# Patient Record
Sex: Male | Born: 1935 | Race: White | Hispanic: No | Smoking: Never smoker
Health system: Southern US, Community
[De-identification: ages and names within clinical notes are randomized; demographics above are authoritative.]

## PROBLEM LIST (undated history)

## (undated) DIAGNOSIS — I10 Essential (primary) hypertension: Secondary | ICD-10-CM

## (undated) DIAGNOSIS — I509 Heart failure, unspecified: Secondary | ICD-10-CM

---

## 2004-01-05 ENCOUNTER — Emergency Department (HOSPITAL_COMMUNITY): Admission: EM | Admit: 2004-01-05 | Discharge: 2004-01-05 | Payer: Self-pay | Admitting: Emergency Medicine

## 2006-10-04 ENCOUNTER — Emergency Department (HOSPITAL_COMMUNITY): Admission: EM | Admit: 2006-10-04 | Discharge: 2006-10-04 | Payer: Self-pay | Admitting: Family Medicine

## 2007-04-26 ENCOUNTER — Inpatient Hospital Stay (HOSPITAL_COMMUNITY): Admission: EM | Admit: 2007-04-26 | Discharge: 2007-05-02 | Payer: Self-pay | Admitting: Emergency Medicine

## 2007-04-27 ENCOUNTER — Encounter (INDEPENDENT_AMBULATORY_CARE_PROVIDER_SITE_OTHER): Payer: Self-pay | Admitting: Internal Medicine

## 2007-05-01 ENCOUNTER — Encounter: Payer: Self-pay | Admitting: Cardiovascular Disease

## 2007-05-14 ENCOUNTER — Ambulatory Visit: Payer: Self-pay | Admitting: *Deleted

## 2007-05-28 ENCOUNTER — Ambulatory Visit (HOSPITAL_COMMUNITY): Admission: RE | Admit: 2007-05-28 | Discharge: 2007-05-29 | Payer: Self-pay | Admitting: Cardiovascular Disease

## 2007-08-28 ENCOUNTER — Ambulatory Visit: Payer: Self-pay | Admitting: *Deleted

## 2007-08-28 ENCOUNTER — Inpatient Hospital Stay (HOSPITAL_COMMUNITY): Admission: RE | Admit: 2007-08-28 | Discharge: 2007-08-30 | Payer: Self-pay | Admitting: *Deleted

## 2007-10-01 ENCOUNTER — Ambulatory Visit: Payer: Self-pay | Admitting: *Deleted

## 2007-10-01 ENCOUNTER — Encounter: Admission: RE | Admit: 2007-10-01 | Discharge: 2007-10-01 | Payer: Self-pay | Admitting: *Deleted

## 2009-01-02 IMAGING — CR DG CHEST 1V PORT
2 series · 2 of 2 positions shown · non-contrast
Comparison: 04/26/07.

CLINICAL DATA: Hypertensive crisis.  Congestive heart failure. 
 PORTABLE CHEST ? 1 VIEW:

[view not recorded (1 of 2)]
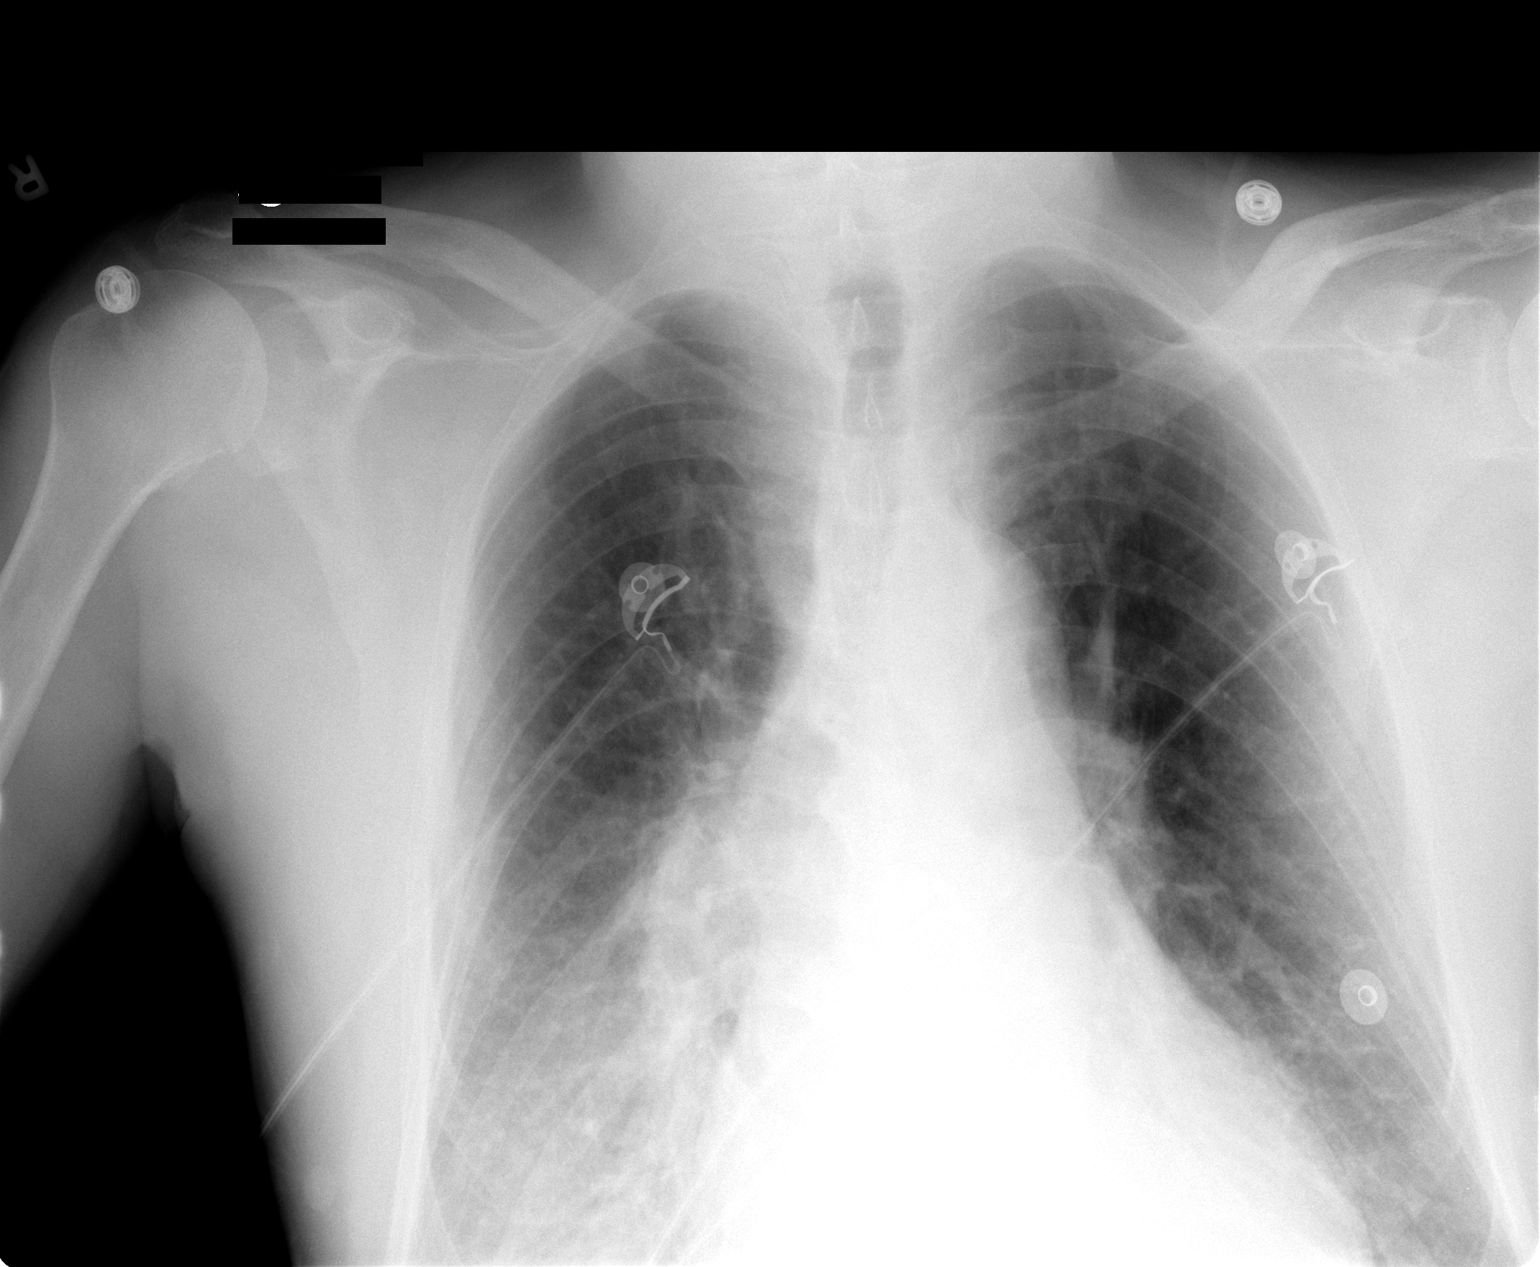

[view not recorded (2 of 2)]
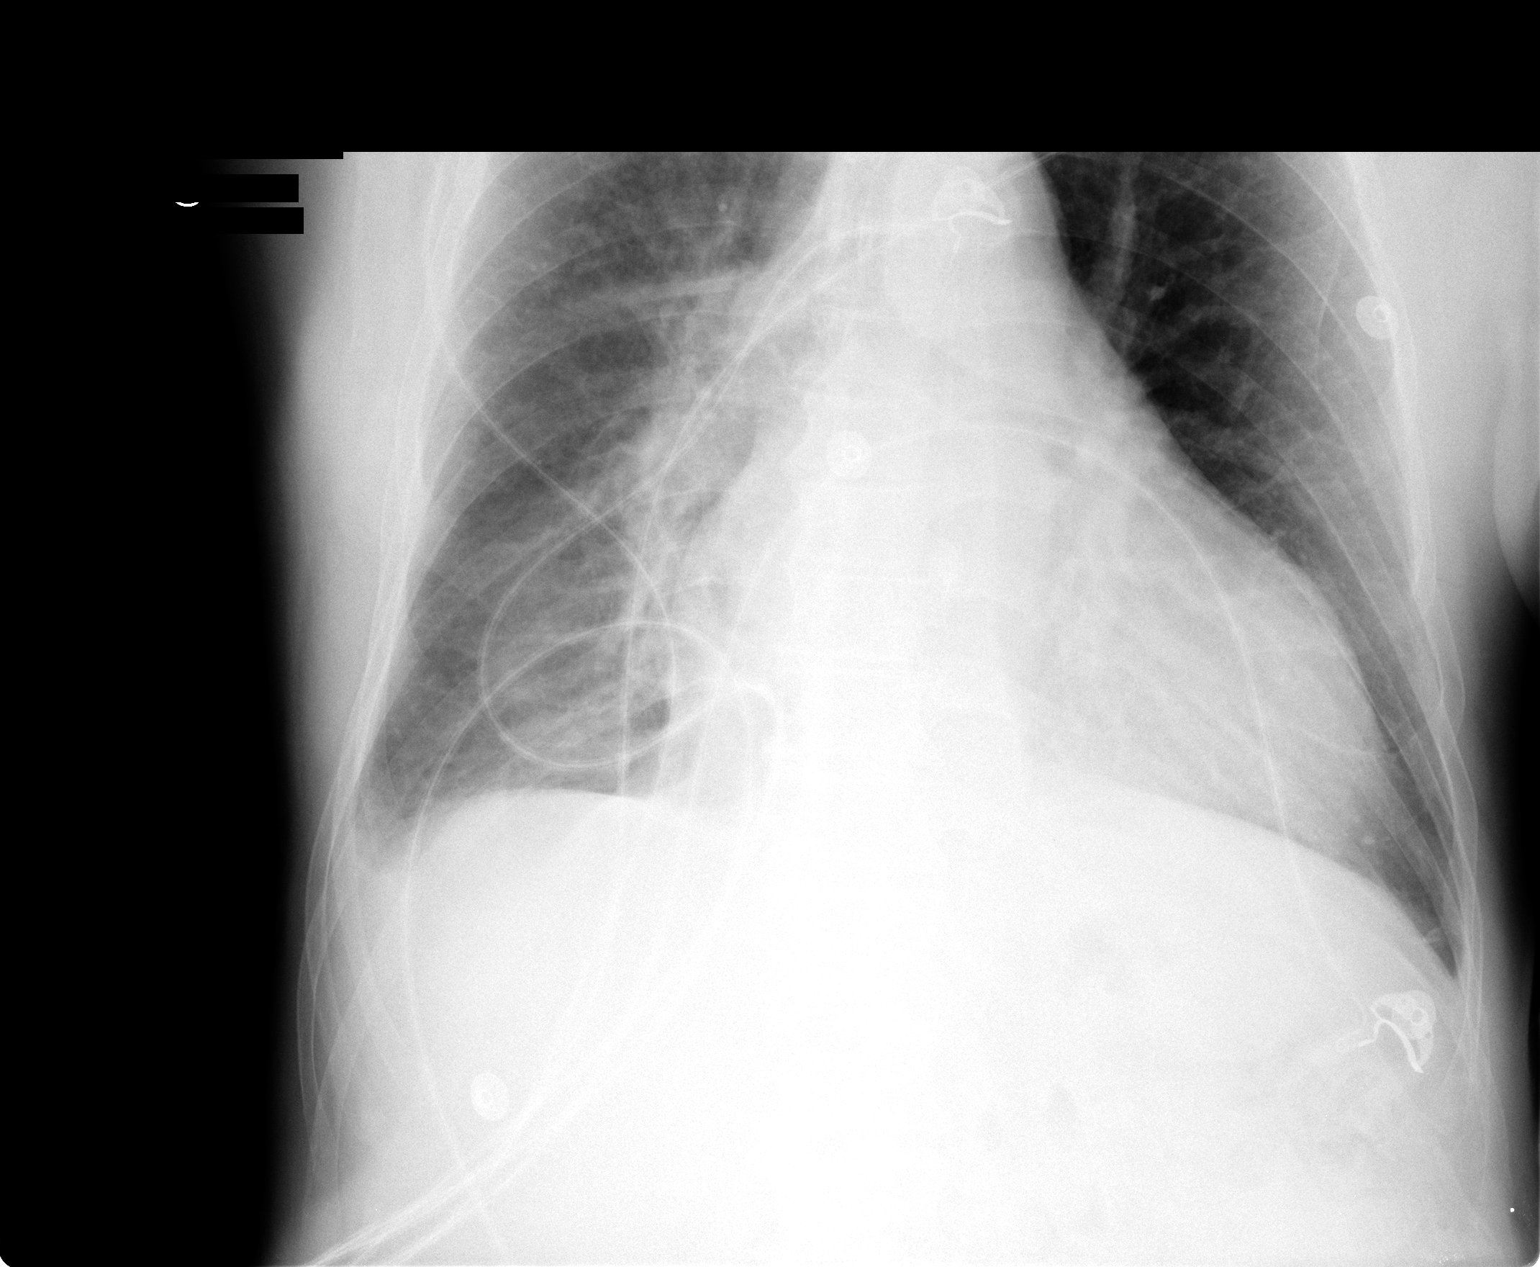

[2 of 2 positions shown; findings below may reference images not displayed]

FINDINGS: Cardiomegaly is again noted.  Small right pleural effusion appears decreased.  Interstitial pulmonary edema appears slightly improved.
IMPRESSION: Some interval improvement in interstitial edema and small right effusion.

## 2009-01-03 IMAGING — CR DG CHEST 2V
2 series · 2 of 2 positions shown · non-contrast
Comparison: 04/27/07.

CLINICAL DATA: Shortness of breath.
 CHEST - 2 VIEW:

[w chest pa]
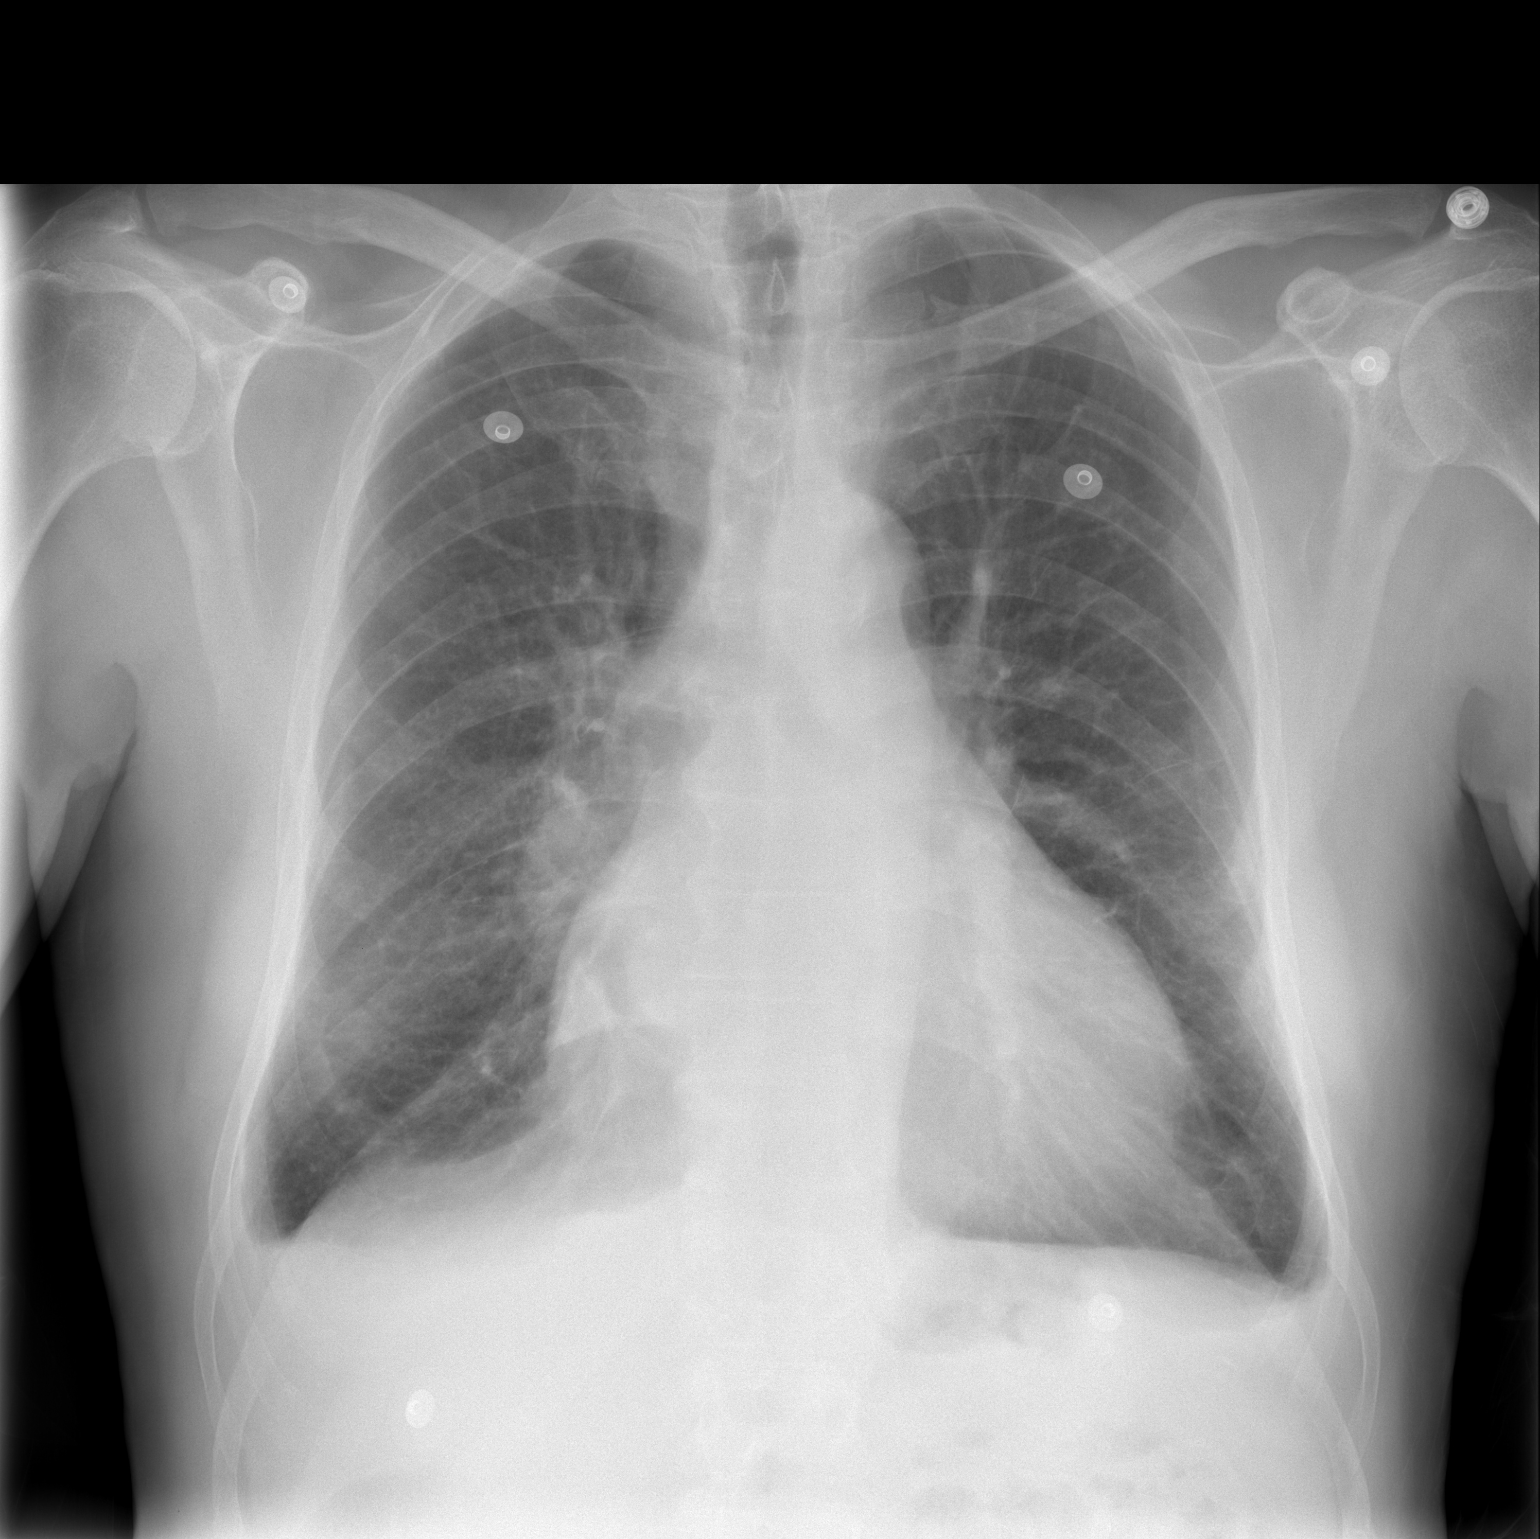

[w chest lat]
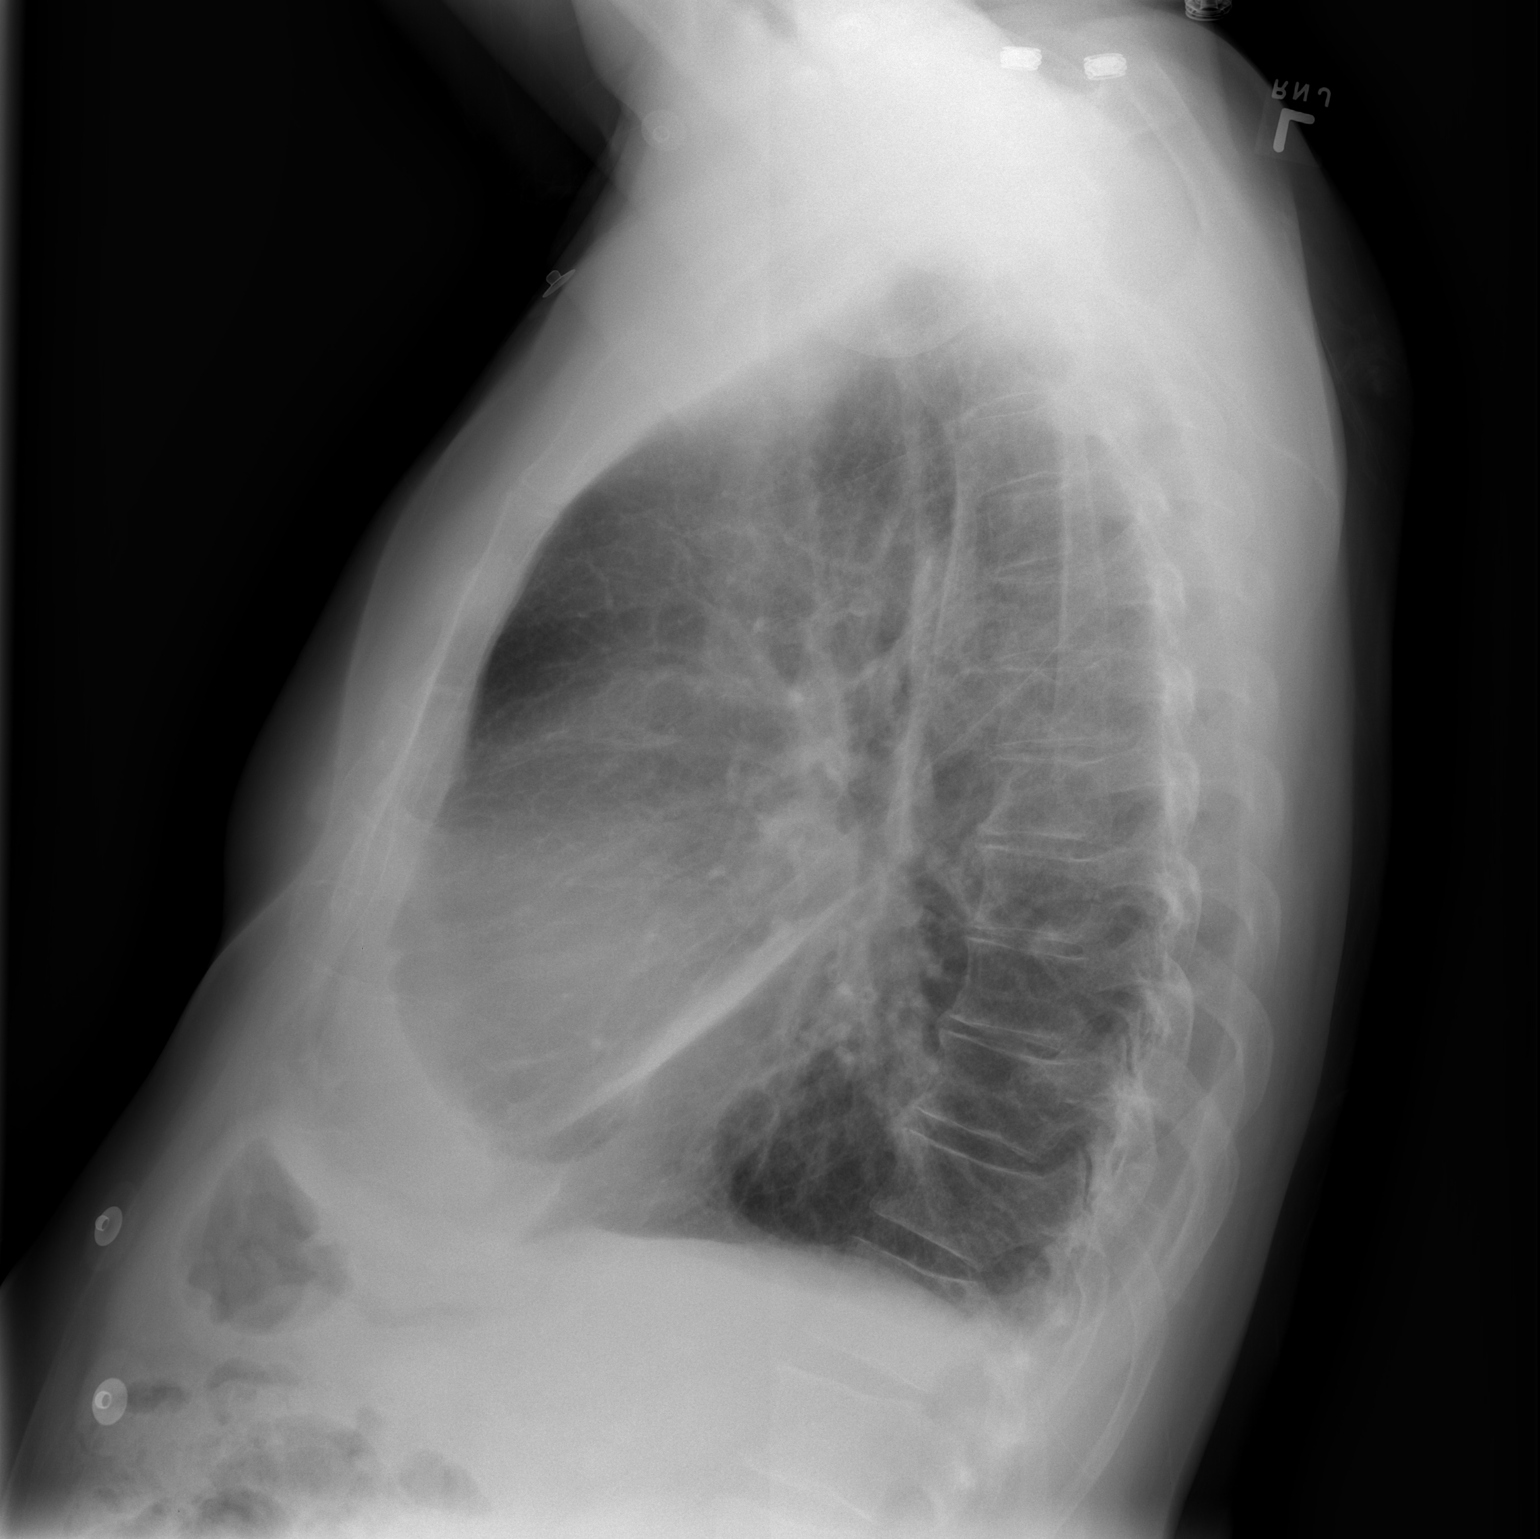

[2 of 2 positions shown; findings below may reference images not displayed]

FINDINGS: Interval improved aeration with partial clearing edema.   Cardiac enlargement redemonstrated. Small effusions.
IMPRESSION: Improved aeration with partial clearing edema.

## 2009-02-26 ENCOUNTER — Emergency Department (HOSPITAL_COMMUNITY): Admission: EM | Admit: 2009-02-26 | Discharge: 2009-02-26 | Payer: Self-pay | Admitting: Emergency Medicine

## 2009-02-26 ENCOUNTER — Emergency Department (HOSPITAL_COMMUNITY): Admission: EM | Admit: 2009-02-26 | Discharge: 2009-02-26 | Payer: Self-pay | Admitting: Family Medicine

## 2009-04-14 ENCOUNTER — Encounter: Payer: Self-pay | Admitting: Emergency Medicine

## 2009-04-15 ENCOUNTER — Inpatient Hospital Stay (HOSPITAL_COMMUNITY): Admission: RE | Admit: 2009-04-15 | Discharge: 2009-04-17 | Payer: Self-pay | Admitting: Cardiology

## 2009-05-05 IMAGING — RF DG ABDOMEN 2V
1 series · 7 of 7 positions shown · non-contrast
Comparison: CT 05/01/2007

CLINICAL DATA: Aortic aneurysm

ABDOMEN - 2 VIEW

[Series 1: run · 7 of 7 slices shown]
[im 1/7]
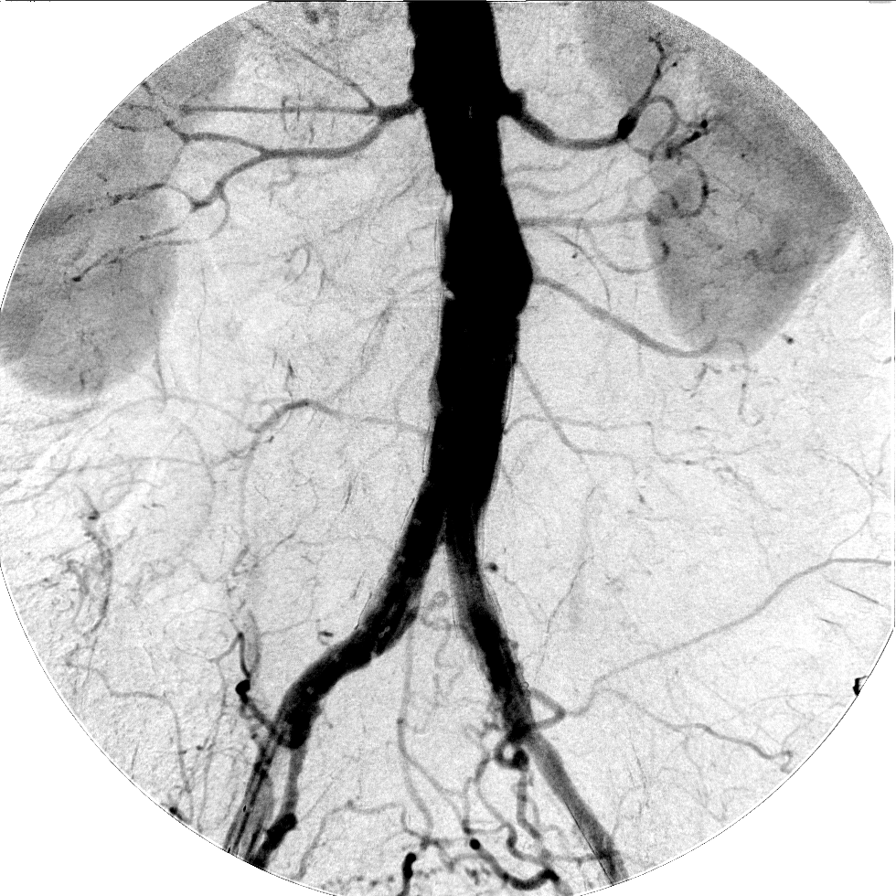
[im 2/7]
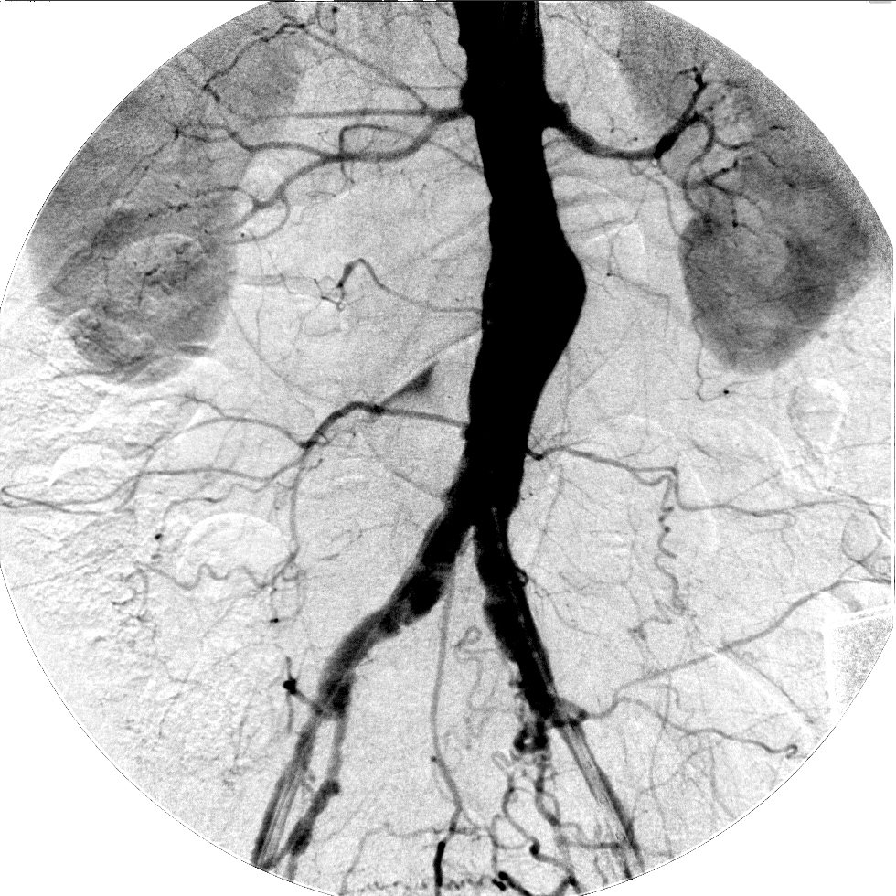
[im 3/7]
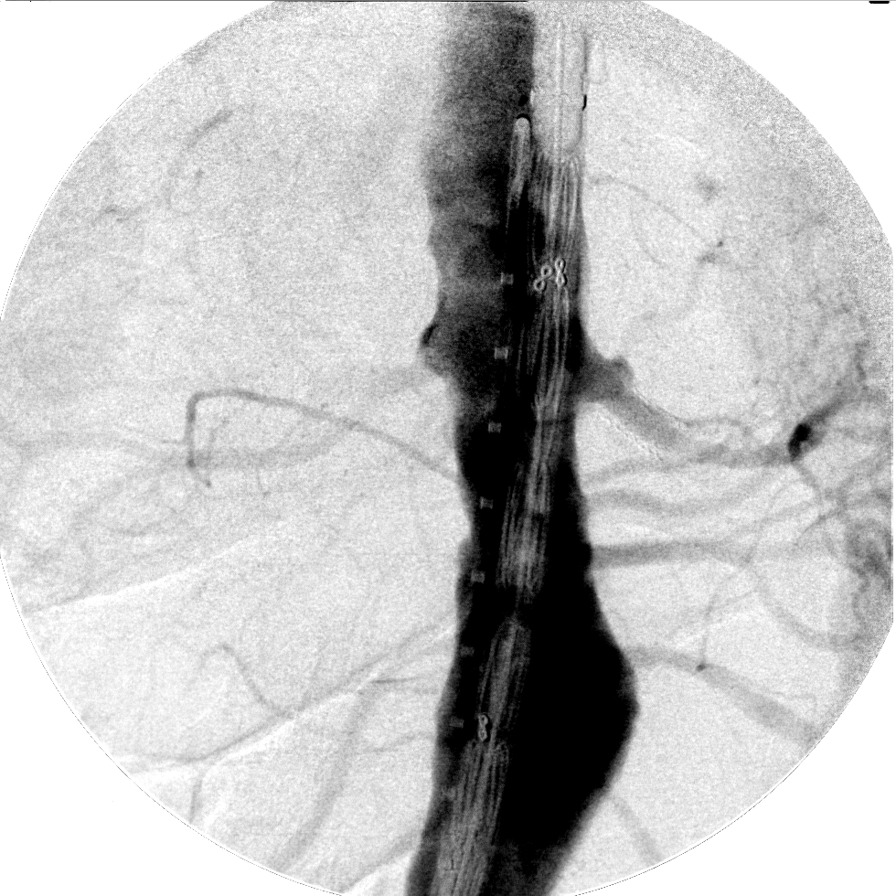
[im 4/7]
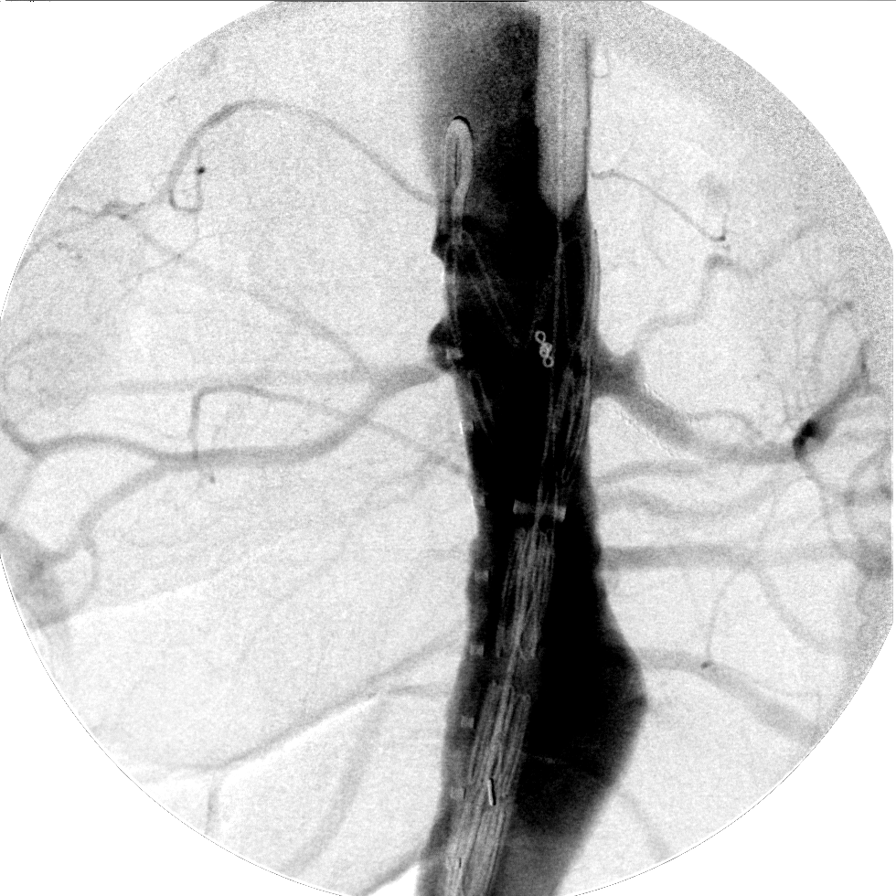
[im 5/7]
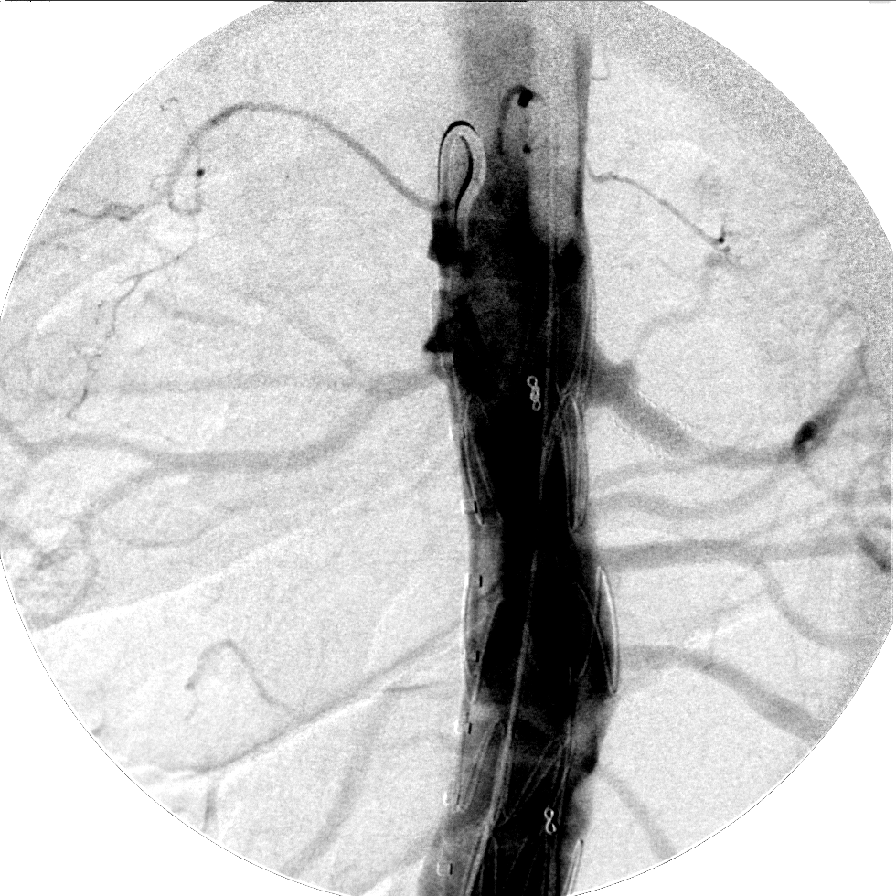
[im 6/7]
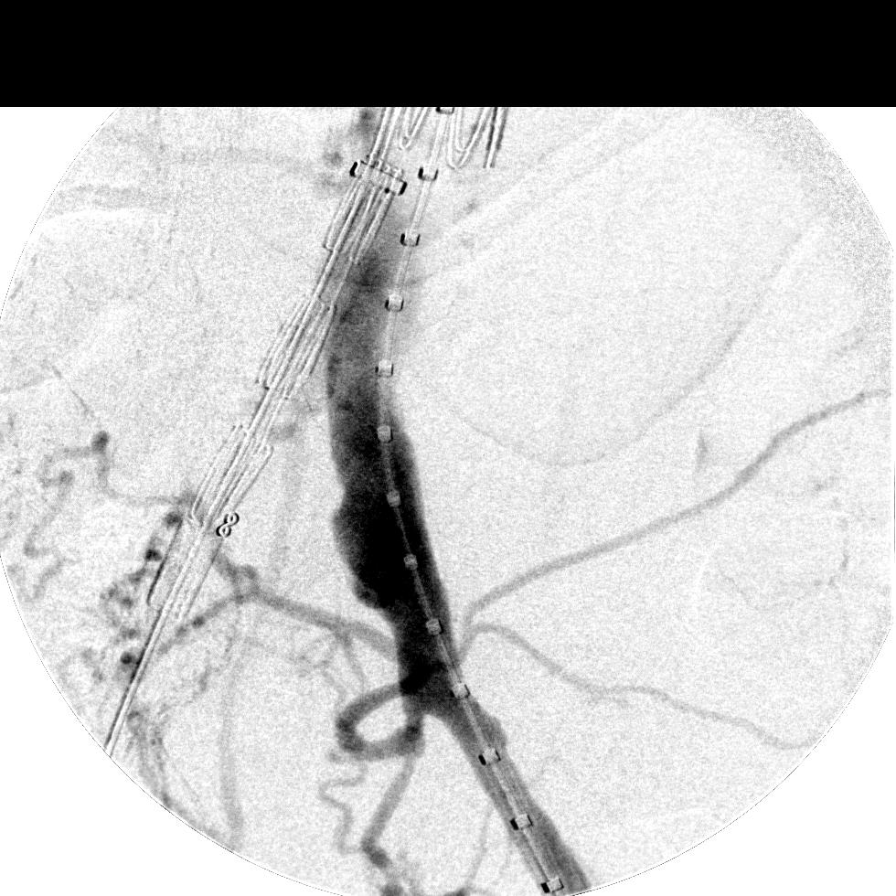
[im 7/7]
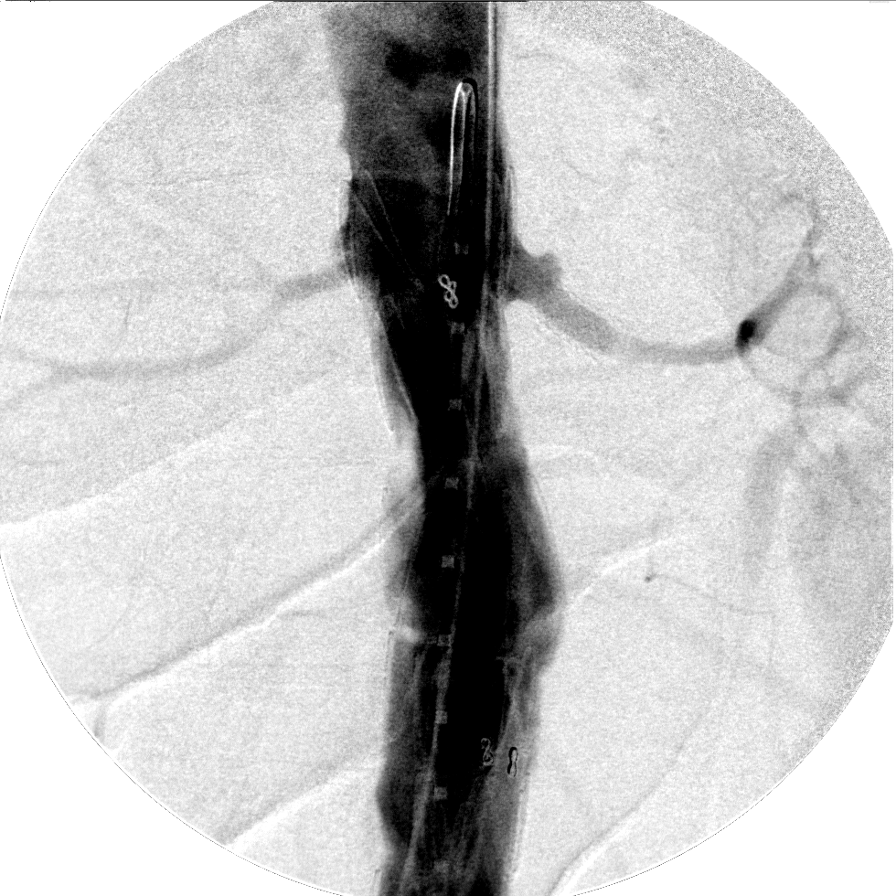

[7 of 7 positions shown; findings below may reference images not displayed]

FINDINGS: Seven C-arm intraoperative images are submitted.  These
document deployment of a bifurcated aortic stent graft with
suprarenal fixation.  Renal arteries remain patent.
IMPRESSION: 1.  Bifurcated aortic stentgraft with suprarenal fixation.

## 2009-07-02 ENCOUNTER — Emergency Department (HOSPITAL_COMMUNITY): Admission: EM | Admit: 2009-07-02 | Discharge: 2009-07-02 | Payer: Self-pay | Admitting: Emergency Medicine

## 2010-05-27 ENCOUNTER — Encounter: Payer: Self-pay | Admitting: *Deleted

## 2010-06-21 ENCOUNTER — Other Ambulatory Visit: Payer: Self-pay | Admitting: Cardiovascular Disease

## 2010-06-21 DIAGNOSIS — R911 Solitary pulmonary nodule: Secondary | ICD-10-CM

## 2010-06-21 DIAGNOSIS — Z9889 Other specified postprocedural states: Secondary | ICD-10-CM

## 2010-06-28 ENCOUNTER — Ambulatory Visit
Admission: RE | Admit: 2010-06-28 | Discharge: 2010-06-28 | Disposition: A | Discharge status: Home / Self Care | Payer: Medicare Other | Source: Ambulatory Visit | Attending: Cardiovascular Disease | Admitting: Cardiovascular Disease

## 2010-06-28 ENCOUNTER — Other Ambulatory Visit: Payer: Self-pay

## 2010-06-28 DIAGNOSIS — Z9889 Other specified postprocedural states: Secondary | ICD-10-CM

## 2010-06-28 DIAGNOSIS — R911 Solitary pulmonary nodule: Secondary | ICD-10-CM

## 2010-06-28 MED ORDER — IOHEXOL 350 MG/ML SOLN
100.0000 mL | Freq: Once | INTRAVENOUS | Status: AC | PRN
  Administered 2010-06-28: 100 mL via INTRAVENOUS

## 2010-07-25 LAB — DIFFERENTIAL
Basophils Absolute: 0 10*3/uL (ref 0.0–0.1)
Basophils Relative: 0 % (ref 0–1)
Eosinophils Relative: 2 % (ref 0–5)
Monocytes Absolute: 0.7 10*3/uL (ref 0.1–1.0)
Monocytes Relative: 7 % (ref 3–12)

## 2010-07-25 LAB — CBC
HCT: 47 % (ref 39.0–52.0)
Hemoglobin: 15.5 g/dL (ref 13.0–17.0)
MCHC: 32.9 g/dL (ref 30.0–36.0)
MCV: 86.7 fL (ref 78.0–100.0)
RBC: 5.43 MIL/uL (ref 4.22–5.81)
RDW: 15.1 % (ref 11.5–15.5)

## 2010-07-25 LAB — BASIC METABOLIC PANEL
CO2: 28 mEq/L (ref 19–32)
Chloride: 103 mEq/L (ref 96–112)
GFR calc Af Amer: >60 mL/min (ref >60)
Glucose, Bld: 132 mg/dL — ABNORMAL HIGH (ref 70–99)
Potassium: 4 mEq/L (ref 3.5–5.1)
Sodium: 140 mEq/L (ref 135–145)

## 2010-07-25 LAB — POCT CARDIAC MARKERS
CKMB, poc: 1.1 ng/mL (ref 1.0–8.0)
CKMB, poc: 1.7 ng/mL (ref 1.0–8.0)
Troponin i, poc: <0.05 ng/mL (ref 0.00–0.09)
Troponin i, poc: <0.05 ng/mL (ref 0.00–0.09)

## 2010-07-25 LAB — URINALYSIS, ROUTINE W REFLEX MICROSCOPIC
Bilirubin Urine: NEGATIVE
Hgb urine dipstick: NEGATIVE
Ketones, ur: NEGATIVE mg/dL
Nitrite: NEGATIVE
Specific Gravity, Urine: 1.014 (ref 1.005–1.030)
Urobilinogen, UA: 0.2 mg/dL (ref 0.0–1.0)

## 2010-07-25 LAB — GLUCOSE, CAPILLARY

## 2010-07-25 LAB — URINE MICROSCOPIC-ADD ON

## 2010-08-07 LAB — COMPREHENSIVE METABOLIC PANEL
ALT: 21 U/L (ref 0–53)
Alkaline Phosphatase: 99 U/L (ref 39–117)
CO2: 25 mEq/L (ref 19–32)
GFR calc non Af Amer: >60 mL/min (ref >60)
Glucose, Bld: 148 mg/dL — ABNORMAL HIGH (ref 70–99)
Potassium: 4.6 mEq/L (ref 3.5–5.1)
Sodium: 141 mEq/L (ref 135–145)
Total Bilirubin: 1 mg/dL (ref 0.3–1.2)

## 2010-08-07 LAB — BASIC METABOLIC PANEL
BUN: 18 mg/dL (ref 6–23)
Calcium: 8.2 mg/dL — ABNORMAL LOW (ref 8.4–10.5)
Calcium: 8.4 mg/dL (ref 8.4–10.5)
Calcium: 8.4 mg/dL (ref 8.4–10.5)
Chloride: 106 mEq/L (ref 96–112)
Creatinine, Ser: 0.9 mg/dL (ref 0.4–1.5)
Creatinine, Ser: 1.03 mg/dL (ref 0.4–1.5)
GFR calc Af Amer: >60 mL/min (ref >60)
GFR calc Af Amer: >60 mL/min (ref >60)
GFR calc Af Amer: >60 mL/min (ref >60)
GFR calc non Af Amer: >60 mL/min (ref >60)
GFR calc non Af Amer: >60 mL/min (ref >60)
Glucose, Bld: 103 mg/dL — ABNORMAL HIGH (ref 70–99)
Potassium: 3.7 mEq/L (ref 3.5–5.1)
Sodium: 139 mEq/L (ref 135–145)
Sodium: 141 mEq/L (ref 135–145)

## 2010-08-07 LAB — CARDIAC PANEL(CRET KIN+CKTOT+MB+TROPI)
CK, MB: 3.7 ng/mL (ref 0.3–4.0)
Relative Index: INVALID (ref 0.0–2.5)
Relative Index: INVALID (ref 0.0–2.5)
Total CK: 80 U/L (ref 7–232)
Total CK: 96 U/L (ref 7–232)
Troponin I: 0.06 ng/mL (ref 0.00–0.06)

## 2010-08-07 LAB — BRAIN NATRIURETIC PEPTIDE
Pro B Natriuretic peptide (BNP): 1120 pg/mL — ABNORMAL HIGH (ref 0.0–100.0)
Pro B Natriuretic peptide (BNP): 1799 pg/mL — ABNORMAL HIGH (ref 0.0–100.0)
Pro B Natriuretic peptide (BNP): 386 pg/mL — ABNORMAL HIGH (ref 0.0–100.0)
Pro B Natriuretic peptide (BNP): 495 pg/mL — ABNORMAL HIGH (ref 0.0–100.0)

## 2010-08-07 LAB — CBC
Hemoglobin: 14.6 g/dL (ref 13.0–17.0)
Hemoglobin: 14.6 g/dL (ref 13.0–17.0)
Hemoglobin: 16.9 g/dL (ref 13.0–17.0)
MCV: 88.9 fL (ref 78.0–100.0)
RBC: 4.89 MIL/uL (ref 4.22–5.81)
RBC: 5.74 MIL/uL (ref 4.22–5.81)
RDW: 15.7 % — ABNORMAL HIGH (ref 11.5–15.5)
WBC: 7.2 10*3/uL (ref 4.0–10.5)
WBC: 8.9 10*3/uL (ref 4.0–10.5)

## 2010-08-07 LAB — URINALYSIS, ROUTINE W REFLEX MICROSCOPIC
Bilirubin Urine: NEGATIVE
Hgb urine dipstick: NEGATIVE
Ketones, ur: NEGATIVE mg/dL
Nitrite: NEGATIVE
Specific Gravity, Urine: 1.012 (ref 1.005–1.030)
pH: 5 (ref 5.0–8.0)

## 2010-08-07 LAB — POCT I-STAT, CHEM 8
Calcium, Ion: 1.06 mmol/L — ABNORMAL LOW (ref 1.12–1.32)
Chloride: 112 mEq/L (ref 96–112)
Creatinine, Ser: 1.1 mg/dL (ref 0.4–1.5)
Glucose, Bld: 140 mg/dL — ABNORMAL HIGH (ref 70–99)
HCT: 56 % — ABNORMAL HIGH (ref 39.0–52.0)

## 2010-08-07 LAB — PROTIME-INR
INR: 1.05 (ref 0.00–1.49)
Prothrombin Time: 13.6 seconds (ref 11.6–15.2)

## 2010-08-07 LAB — DIFFERENTIAL
Basophils Relative: 0 % (ref 0–1)
Eosinophils Absolute: 0.1 10*3/uL (ref 0.0–0.7)
Neutrophils Relative %: 75 % (ref 43–77)

## 2010-08-07 LAB — GLUCOSE, CAPILLARY

## 2010-08-07 LAB — MAGNESIUM: Magnesium: 2.2 mg/dL (ref 1.5–2.5)

## 2010-08-07 LAB — TSH: TSH: 1.359 u[IU]/mL (ref 0.350–4.500)

## 2010-08-09 LAB — CBC
HCT: 45.9 % (ref 39.0–52.0)
Hemoglobin: 15.2 g/dL (ref 13.0–17.0)
MCHC: 33.2 g/dL (ref 30.0–36.0)
MCV: 88.7 fL (ref 78.0–100.0)
RBC: 5.18 MIL/uL (ref 4.22–5.81)

## 2010-08-09 LAB — DIFFERENTIAL
Basophils Relative: 1 % (ref 0–1)
Eosinophils Absolute: 0.1 10*3/uL (ref 0.0–0.7)
Eosinophils Relative: 1 % (ref 0–5)
Lymphs Abs: 1.4 10*3/uL (ref 0.7–4.0)
Monocytes Absolute: 0.6 10*3/uL (ref 0.1–1.0)
Monocytes Relative: 8 % (ref 3–12)
Neutrophils Relative %: 75 % (ref 43–77)

## 2010-08-09 LAB — POCT I-STAT, CHEM 8
Calcium, Ion: 1.13 mmol/L (ref 1.12–1.32)
Creatinine, Ser: 1 mg/dL (ref 0.4–1.5)
Glucose, Bld: 115 mg/dL — ABNORMAL HIGH (ref 70–99)
HCT: 48 % (ref 39.0–52.0)
Hemoglobin: 16.3 g/dL (ref 13.0–17.0)
TCO2: 23 mmol/L (ref 0–100)

## 2010-08-09 LAB — URINALYSIS, ROUTINE W REFLEX MICROSCOPIC
Bilirubin Urine: NEGATIVE
Hgb urine dipstick: NEGATIVE
Ketones, ur: NEGATIVE mg/dL
Nitrite: NEGATIVE
Specific Gravity, Urine: 1.019 (ref 1.005–1.030)
pH: 5.5 (ref 5.0–8.0)

## 2010-09-18 NOTE — Cardiovascular Report (Signed)
NAMEMORRISON, MCBRYAR                ACCOUNT NO.:  [id number]   MEDICAL RECORD NO.:  [id number]          PATIENT TYPE:  AMB   LOCATION:  SDS                          FACILITY:  [facility]   PHYSICIAN:  Vonna Kotyk R. Jacinto Halim, MD       DATE OF BIRTH:  1934-06-12   DATE OF PROCEDURE:  05/28/2007  DATE OF DISCHARGE:                            CARDIAC CATHETERIZATION   PROCEDURE PERFORMED:  PTA and direct stenting of the left renal artery.   INDICATION:  Mr. Hufnagle is a 72-year gentleman with known renal artery  stenosis.  He has difficult-to-control hypertension.  He had a high-  grade  stenosis of the left renal artery.  He also has known abdominal  aortic aneurysm.  He underwent diagnostic peripheral angiography by Dr.  Alanda Amass, and this confirmed high-grade stenosis of the left renal  artery.  Given the fact that he has difficult-to-control hypertension  and a significant high-grade stenosis of the left renal artery, I was  called upon for elective intervention to his renal artery.   ANGIOGRAPHIC DATA:  Left renal artery:  The left renal artery showed a  high-grade 90% stenosis in the mid body of the left renal artery.   INTERVENTION:  Successful PTA and direct stenting of the left renal  artery with implantation of a 6 x 12 mm Herculink stent, which was  deployed at 14 atmospheres of pressure.  The stenosis was reduced from  90% to 0%.  There was no residual pressure gradient across the stenosis.   RECOMMENDATIONS:  The patient will be continued on risk modification.  He will follow up with Dr. Pearletha Furl. Alanda Amass for further management  and evaluation.   PROCEDURE:  Please see the diagnostic peripheral angiography dictation  by Dr. Alanda Amass.   Using a 6-French short LIMA guide catheter, the left renal artery was  selectively cannulated, and, using a Stabilizer 1/4 guidewire, I was  able to cannulate the left renal artery with mild amount of difficulty.  After having performed this, having  heparin on board, I proceeded with  direct stenting with a 6 x 12 mm Herculink stent, which was deployed at  a peak of 14 atmospheres of pressure for 90 seconds.  Post angioplasty  and stent implantation, angiography revealed excellent results.  The  guidewire was withdrawn, guide catheter disengaged and pulled out of the  body.  The patient tolerated the procedure.  No immediate complications  noted.      Cristy Hilts. Jacinto Halim, MD  Electronically Signed     JRG/MEDQ  D:  05/28/2007  T:  05/28/2007  Job:  [id number]   cc:   Gerlene Burdock A. Alanda Amass, M.D.

## 2010-09-18 NOTE — Op Note (Signed)
NAMEBRANDN, MCGATH                ACCOUNT NO.:  [id number]   MEDICAL RECORD NO.:  [id number]          PATIENT TYPE:  INP   LOCATION:  3302                         FACILITY:  [facility]   PHYSICIAN:  Balinda Quails, M.D.    DATE OF BIRTH:  1934/08/07   DATE OF PROCEDURE:  08/28/2007  DATE OF DISCHARGE:                               OPERATIVE REPORT   SURGEON:  Balinda Quails, MD   CO-SURGEON:  Janetta Hora. Fields, MD   ANESTHESIA:  General endotracheal.   PREOPERATIVE DIAGNOSIS:  A 5.5 cm abdominal aortic aneurysm.   POSTOPERATIVE DIAGNOSIS:  A 5.5 cm abdominal aortic aneurysm.   PROCEDURE:  Endovascular abdominal aortic aneurysm repair (Talent 28 x  14 x 140 primary right, 14 x 16 x 90 left).   CLINICAL NOTE:  Mr. [name] is a 76 year old gentleman who was  found to have a large abdominal aortic aneurysm measuring 5.5 cm during  recent hospital admission due to congestive heart failure.  He has been  treated well for congestive heart failure and has undergone a left renal  artery stent.  He is brought to the operating at this time electively  for placement of an abdominal aortic stent graft.   OPERATIVE PROCEDURE:  The patient was brought to the operating room in  stable hemodynamic condition.  Placed under general endotracheal  anesthesia.  In the supine position, the abdomen and both legs were  prepped and draped in a sterile fashion.   A right femoral cutdown carried out.  Oblique skin incision made over  the right common femoral artery.  Dissection carried down through  subcutaneous tissue and lymphatics with electrocautery.  Branching veins  ligated with 3-0 silk and divided.  Right common femoral artery was  exposed up to the inguinal ligament and encircled proximally with a  vessel loop.  Distal dissection carried down to the origin of the  superficial femoral and profunda femoris arteries and the distal common  femoral artery encircled with a vessel loop.  The right  common femoral  artery was soft and of good caliber.   A left femoral cutdown is dictated under a separate heading by Dr.  Darrick Penna.   With bilateral common femoral arteries exposed, access was obtained  bilaterally with an 18 gauge needle and 0.035 Bentson guidewires were  advanced into the abdominal aorta under fluoroscopy bilaterally.  The 8-  French sheaths were then advanced over the guidewires bilaterally.   The contralateral left femoral artery was accessed with a graduated Omni  flush catheter.  This was advanced up to the suprarenal position.  An  abdominal aortogram obtained.  Measurements obtained to verify the  length of the bifurcated device at 140 cm.   A guidewire exchange was then made for an Amplatz super-stiff guidewire  to the ipsilateral right groin.  The bifurcated device was then advanced  up the right side 28 x 14 x 140.  This was placed in an anterior  contralateral position.  Using a small volume aortic flush, the device  was then deployed in the immediate infrarenal position.  The left renal  stent was not disturbed.  The device brought down and the contralateral  limb was exposed and opened.  The Omni flush catheter brought down into  the distal aorta and using a Bentson guidewire, the contralateral limb  was accessed, the Omni flush advanced over the guidewire, and an  exchange made for an Amplatz super-stiff guidewire.   A retrograde injection then made through the left iliac system to verify  the position and takeoff of the left hypogastric artery.  A 14 x 16 x 90  cm contralateral limb was then advanced over the Amplatz super-stiff  guidewire into the contralateral gate and deployed down into the left  common iliac artery just proximal to the takeoff of the hypogastric.   The bifurcated device was then completely deployed in the right iliac  system.  The sheaths were removed bilaterally and 12-French sheaths  advanced over the guidewires.  Dilators were  removed.   A caudal balloon was then advanced over the right femoral sheath up to  the proximal neck and inflated.  The caudal balloon was also inflated  throughout the length of the right common iliac stent.  The caudal  balloon then removed and inflation carried out in the left iliac system  at the junction and down through the length of the left common iliac  artery.   An Omni flush catheter was then re-advanced over the ipsilateral  guidewire and a completion aortogram obtained.  This revealed brisk flow  through the renal arteries bilaterally.  No evidence of proximal type 1  leak.  There was type 2 leak present.  No evidence of type 3 or type 4  leak.  Good distal apposition without evidence of distal type 1 leak.  Excellent flow through the iliac arteries without evidence of graft  stenosis.   This completed the interventional procedure.  The guidewires and sheaths  were removed bilaterally and the femoral vessels were controlled with  clamps.  The transverse arteriotomies closed with running 5-0 Prolene  suture.  Groin incisions irrigated.  Subcutaneous tissue closed  bilaterally with running 2-0 Vicryl suture in 2 layers.  Skin closed  with 4-0 Monocryl.  Dermabond applied.   The patient tolerated the procedure well.  There were no apparent  complications.   The patient did receive 7000 units of heparin intravenously prior to  access of the arteries.  Also, he did receive perioperative antibiotics  appropriately.      Balinda Quails, M.D.  Electronically Signed     PGH/MEDQ  D:  08/28/2007  T:  08/29/2007  Job:  [id number]   cc:   Gerlene Burdock A. Alanda Amass, M.D.

## 2010-09-18 NOTE — H&P (Signed)
NAMEBRANDLEY, [name]                ACCOUNT NO.:  [id number]   MEDICAL RECORD NO.:  [id number]          PATIENT TYPE:  INP   LOCATION:  3302                         FACILITY:  [facility]   PHYSICIAN:  [name], M.D.    DATE OF BIRTH:  1934-06-09   DATE OF ADMISSION:  08/28/2007  DATE OF DISCHARGE:                              HISTORY & PHYSICAL   CARDIOLOGIST:  Adam A. Alanda Amass, MD   ADMISSION DIAGNOSIS:  A 5.5-cm abdominal aortic aneurysm.   EXTENDING HISTORY:  [name] is a 76 year old gentleman who was  discharged from Hospital in December 2008 after an admission for  congestive heart failure.  At that time, he was noted to have a large  abdominal aortic aneurysm.  CT scan revealed this to be 5.5 cm in  maximal diameter.  He was also noted to have a left renal artery  stenosis.  He has undergone stenting of this.   Risks factors for aneurysmal disease include hypertension, male sex, and  tobacco use.   PAST MEDICAL HISTORY:  1. Congestive heart failure.  2. Nonischemic cardiomyopathy.  3. Renovascular disease status post left renal artery stenting.  4. Hypertension.  5. Hyperlipidemia   MEDICATIONS:  1. Lisinopril 60 mg p.o. daily.  2. Lasix 40 mg p.o. daily.  3. Plavix 75 mg p.o. daily.  4. Metoprolol 100 mg p.o. daily.  5. Pantoprazole 40 mg daily.  6. Amlodipine 5 mg daily.  7. Aspirin 81 mg daily.   ALLERGIES:  None known.   FAMILY HISTORY:  Mother died at age 48 of complications of cancer.  Father died at age 1 of COPD.  A brother deceased at age 61 with a  history of cerebral palsy.  One sister living age 89, generally well.   SOCIAL HISTORY:  The patient is married with 5 grown children.  He is  retired from [employer], [employer].  He discontinued tobacco use about 8  months ago.  Not a regular alcohol user.   REVIEW OF SYSTEMS:  Positive findings include chest pressure, shortness  of breath on lying flat, and shortness of breath with exertion.  He  notes occasional wheezing.  He does have arthritic joint discomfort.   PHYSICAL EXAMINATION:  GENERAL: A 76 year old alert, oriented male.  No  acute distress.  Appears of his stated age.  VITAL SIGNS: BP is 183/92, pulse 67 per minute, respirations 20 per  minute, and temperature 97 to O2 sat 95%.  HEENT: Mouth and throat were clear.  Normocephalic.  Extraocular  movements intact.  NECK: Supple.  No thyromegaly or adenopathy.  CARDIOVASCULAR: No carotid bruits.  Normal heart sounds without extra  sounds or murmurs.  No gallops or rubs.  Regular rate and rhythm.  CHEST: Air entry equal bilaterally.  No rales or rhonchi.  Normal breath  sounds.  ABDOMEN: Soft, nontender.  AAA palpable.  No organomegaly or masses  felt.  Normal bowel sounds without bruits.  LOWER EXTREMITIES: Femoral pulses are 2+ bilaterally.  No ankle edema.  1+ right dorsalis pedis and 2+ left posterior tibial pulse.  NEUROLOGIC: Cranial nerves  intact.  Strength equal bilaterally.  Reflexes are 2+.  SKIN: No rash or ulceration.   ADMISSION DIAGNOSES:  1. A 5.5-cm abdominal aortic aneurysm.  2. Nonischemic cardiomyopathy.  3. Congestive heart failure.  4. Left renal artery stenosis status post stenting.  5. Hypertension.  6. Hyperlipidemia.   ADMISSION PLAN:  The patient will be admitted electively to hospital for  placement of an aortic stent graft for treatment of 5.5-cm abdominal  aortic aneurysm.      [name], M.D.  Electronically Signed     PGH/MEDQ  D:  08/28/2007  T:  08/28/2007  Job:  [id number]   cc:   [name], M.D.

## 2010-09-18 NOTE — Assessment & Plan Note (Signed)
OFFICE VISIT   MAHAMED, ZALEWSKI  DOB:  15-May-1934                                       10/01/2007  NFAOZ#:[id number]   The patient underwent endovascular aneurysm repair with Talent stent  graft on 08/28/2007 at [facility].  Prior to this he had  placement of a left renal stent by Dr. Alanda Amass.   Is doing well since his surgery.  No major complaints.  Groin incisions  are healing unremarkably.  BP is noted to be elevated at 200/100.  Repeat was 180/90.   CT scan performed at this time reveals no complicating features related  to a AAA stent.  No endoleak.  Stent remains patent.   Will plan followup again in 6 months with a CT scan.   Balinda Quails, M.D.  Electronically Signed   PGH/MEDQ  D:  10/01/2007  T:  10/02/2007  Job:  [id number]   cc:   Richard A. Alanda Amass, M.D.

## 2010-09-18 NOTE — Discharge Summary (Signed)
[name], [name]                ACCOUNT NO.:  [id number]   MEDICAL RECORD NO.:  [id number]          PATIENT TYPE:  INP   LOCATION:  2006                         FACILITY:  [facility]   PHYSICIAN:  [name], M.D.DATE OF BIRTH:  1934-11-08   DATE OF ADMISSION:  05/28/2007  DATE OF DISCHARGE:  05/29/2007                               DISCHARGE SUMMARY   DISCHARGE DIAGNOSES:  1. Deep vein thrombosis status post catheterization yesterday with      left renal artery stenting.  2. Infrarenal __________  abdominal aortic aneurysm; the patient      expects endoluminal grafting by Dr. Madilyn Barnes.  3. Severe non-ischemic cardiomyopathy with ejection fraction 25%.  4. Hypertension renovascular component.  5. Hyperlipidemia.  6. The patient is a candidate for automatic implantable cardioverter      defibrillator with low ejection fraction for primary prevention of      sudden cardiac death syndrome.   HISTORY OF PRESENT ILLNESS:  This is a 76 year old Caucasian male who is  admitted to the hospital for __________  stenting of the renal artery.  He was evaluated in our office on May 22, 2007 and, prior to that  time had renal and abdominal ultrasound showing abnormal results.  For  more definitive diagnosis and therapy, he was admitted to [facility].  On May 28, 2007, the patient underwent diagnostic  catheterization, which revealed 95% left renal artery stenosis and  angioplasty and stent was performed on the same day with reduction of  stenosis from 95 to 0%.  The patient tolerated the procedure well and  did not have any immediate or delayed complications.  His abdominal  aortic aneurysm was measured by CT at 5.5 cm infrarenal diameter and  good 42 mm infrarenal neck, non-involved aorta.  The infrarenal neck  diameter was 25 mm.  The length of the aneurysm was 46 mm ending at the  iliac bifurcation.  The patient also has a history of elevated PSA.  During his recent  hospitalization, it was thought to be related to his  Foley catheter insertion.  The patient stable the next morning of the  procedure.  Vital signs within normal limits.  Laboratories did not  reveal any abnormalities, showing normal hemoglobin, hematocrit, BUN,  creatinine.   DISCHARGE MEDICATIONS:  1. Lopressor 25 mg p.o. daily.  2. Lisinopril 40 mg daily.  3. Protonix 40 mg daily.  4. Plavix 75 mg daily.  5. Aspirin 325 mg daily.   DISCHARGE FOLLOWUP:  Renal ultrasound will be done in our office on  February 2 at 9:30 a.m.  Followup with Dr. Alanda Barnes is on February 9, also at 9:30 a.m.      [name], P.A.      [name], M.D.  Electronically Signed    MK/MEDQ  D:  05/29/2007  T:  05/29/2007  Job:  [id number]   cc:   [name], M.D.

## 2010-09-18 NOTE — Cardiovascular Report (Signed)
[name], [name]                ACCOUNT NO.:  [id number]   MEDICAL RECORD NO.:  [id number]          PATIENT TYPE:  INP   LOCATION:  2807                         FACILITY:  [facility]   PHYSICIAN:  Richard A. Alanda Amass, M.D.DATE OF BIRTH:  [birth year]   DATE OF PROCEDURE:  05/28/2007  DATE OF DISCHARGE:                            CARDIAC CATHETERIZATION   PROCEDURE:  Retrograde abdominal aortic catheterization, abdominal  aortic angiogram midstream PA projection, bilateral iliac angiogram  midstream PA projection, right and left iliac angiograms oblique  projection, subselective right and left renal artery angiograms.   PROCEDURE:  The patient was brought to the second floor PV lab in  postabsorptive state after premedication with 5 mg Valium p.o.[name]  1%  Xylocaine was used for local anesthesia and the patient was given 2 mg  of Versed for sedation at the beginning of the procedure. Preoperative  creatinine was 1.3.  The right groin was prepped, draped in usual  manner, 1% Xylocaine was used for local anesthesia.  RCFA was entered  with single anterior puncture using 18 thin-wall needle and a 6-French  short sidearm sheath was inserted without difficulty.  Teflon-coated J-  tip guidewire exchange was used throughout the procedure.  A 5-French  marker pigtail catheter was used for abdominal aortic angiogram in the  midstream PA projection at 20 mL 20 mL  per second with DSA.  Catheter  was brought down above the iliac bifurcation and iliac angiography was  done in the PA projection with visualization down to the SFA profunda  junctions bilaterally at 20 mL 20 mL per second.  Right selective right  iliac angiogram was done in the oblique projection. It demonstrates the  hypogastric artery at 10 mL 10 mL per second.  Catheter was then  exchanged for a crossover  5-French IMA catheter which was positioned in  the left common iliac ostia and injection was done at 10 mL 10 mL per  second to  visualize the left iliac system.  The same catheter was then  advanced above the level of the renal arteries over the guidewire and  subselective right and left renal angiography was performed at 5 mL.  Catheters removed.  Side-arm sheath was flushed.  Arterial pressures  were monitored throughout the procedure.  The patient was given 10 mg  labetalol IV for systemic hypertension with blood pressures of 200/100  and sinus rhythm at 70 per minute.  He will be given further medications  as necessary.  Tolerated the diagnostic procedure well.   Abdominal aortic angiogram demonstrated a patent SMA and celiac axis.   The infrarenal abdominal aorta demonstrated irregularities and saccular  aneurysm in the mid abdominal aorta.  The entire dye column did not show  a large aneurysm but this is large on CT with probable thrombosis and  measured 5.5 cm.   There were irregularities and mild narrowing just above the iliac  bifurcation and there was calcification of the infrarenal abdominal  aorta.  The left renal artery had a proximal non ostial concentric 90-  95% stenosis.   The right renal arteries were  dual.  The superior right renal artery  arose at the level of the left renal artery, had no significant  stenosis.  There was one renal artery on the left   There was single renal artery on the right present with good flow and no  significant  visualized but the ostia was difficult to see so  subselective injection was done which revealed a widely patent ostia and  early bifurcation to the right renal artery.  Both renal were  essentially at the same level.   The left common iliac had lumpy bumpy calcific irregularities but no  high-grade stenosis and about 30 percent stenosis in the midportion.  The LCIA  had 30% smooth narrowing segmentally in its proximal third.   The left hypogastric was intact.  It did have lumpy bumpy appearance  bifurcated and had good flow with moderate diffuse  disease.   The right common iliac had irregularities without any high-grade  stenosis.  The right external iliac had 30-40% narrowing in its proximal  portion.  The right hypogastric was intact.   The SFA profunda junctions were intact bilaterally.   There appeared to be using marked pigtail 10 cm between the inferior  left renal artery and the iliac bifurcation.   It was 6 cm from the bifurcation of the iliacs to the right hypogastric.   There was 16 cm between the renal artery and the right hypogastric.   [name] is a 76 year old divorced father of five with four  grandchildren.  He is a prior smoker.  He currently lives with his prior  wife. They have been together for almost 10 years.  He is a [employer] account planner who [place]   He was recently hospitalized 04/2007 with CHF and near pulmonary edema.  He had 3-4 days of increasing symptoms, but in retrospect had 2-3 months  of symptoms of congestive heart failure.  Catheterization was done  April 28, 2007, right and left heart which demonstrated normal  coronary arteries and PA pressure of 57/20 with EF of less than 20% with  severe global hypokinesis and demonstrated high grade left renal artery  stenosis.   He responded well with medical therapy for CHF and hypertension in the  hospital and his nonischemic cardiomyopathy was felt to be probably due  to chronic hypertension.  We felt that his hypertension was exacerbated  by severe left renal artery stenosis as well.  An  abdominal CT revealed  a 5.5 cm infrarenal abdominal aortic aneurysm with a good 42-mm  infrarenal neck of noninvolved aorta.  The infrarenal neck diameter was  25 mm.  The length of the aneurysm was 46 mm ending before the iliac  bifurcation.  The iliac diameters common were 17 cm bilaterally.   The patient has been seen by Dr. Madilyn Fireman and he was felt to be a candidate  for ELG (endoluminal grafting) because of his high-risk  operative status  related to severe nonischemic cardiomyopathy.  It was felt best to  proceed with left renal intervention for his persistent systemic  hypertension on medical therapy prior to endoluminal grafting.  He will  also need to be considered for possible ICD placement for primary  prevention after EF is reevaluated with optimal medical therapy.  It  should be noted that he had an elevated PSA in the hospital with a  catheter in place and this has been repeated pre catheterization, but  not back as yet.  Renal function has  been normal and there is no history  of diabetes.   Cines were reviewed with Dr. Jacinto Halim and we felt that we should proceed  with left renal artery PTA and stenting in this setting.  This will be  done by Dr. Jacinto Halim.   CATHETERIZATION DIAGNOSIS:  1. Nonischemic cardiomyopathy with recent normal coronary arteries      04/2007  2. Chronic systemic hypertension, probably hypertensive cardiomyopathy  3. High-grade left renal artery stenosis with CHF and severe      hypertension on medical therapy - renovascular hypertension.  4. Asymptomatic infrarenal abdominal aortic aneurysm 5.5 cm with      adequate infrarenal neck for EVAR.  5. Candidate for ICD to be determined at a later time after optimal      medical therapy.  6. Elevated PSA in the hospital 04/2007 with Foley in place.  Repeat      PSA pending  7. Hyperlipidemia.      Richard A. Alanda Amass, M.D.  Electronically Signed     RAW/MEDQ  D:  05/28/2007  T:  05/28/2007  Job:  [id number]   cc:   Balinda Quails, M.D.  Cristy Hilts. Jacinto Halim, MD

## 2010-09-18 NOTE — H&P (Signed)
NAMEJONTUE, CRUMPACKER NO.:  [id number]   MEDICAL RECORD NO.:  [id number]          PATIENT TYPE:  EMS   LOCATION:  ED                           FACILITY:  [facility]   PHYSICIAN:  Gardiner Barefoot, MD    DATE OF BIRTH:  11-Apr-1935   DATE OF ADMISSION:  04/26/2007  DATE OF DISCHARGE:                              HISTORY & PHYSICAL   PRIMARY CARE PHYSICIAN:  Unassigned.   CHIEF COMPLAINT:  Dyspnea.   HISTORY OF PRESENT ILLNESS:  A 76 year old male with no known past  medical history who presented with two days of progressive dyspnea and  cough.  The patient reports was progressive in nature over the last 3-4  days relieved by sitting up and experienced 2-3 pillow orthopnea.  The  patient denies any chest pain.  The patient does report that he uses his  wife's oxygen which provided him with relief as well.  The patient does  have a long history of smoking which he quit in August of this year.  The patient denies any fever, nausea or vomiting.   PAST MEDICAL HISTORY:  None.   MEDICATIONS:  Aspirin.   DRUG ALLERGIES:  No known drug allergies.   SOCIAL HISTORY:  History of tobacco, denies any alcohol or drugs.   FAMILY HISTORY:  No history of diabetes or CAD.   REVIEW OF SYSTEMS:  Negative except as per the History of Present  Illness.   PHYSICAL EXAMINATION:  VITAL SIGNS:  Temperature 98.7, pulse 96,  respirations 20, blood pressure 141/99, O2 sats 99% on face mask.  GENERAL:  The patient is awake, alert, and oriented x3 and appears in  mild respiratory distress.  HEENT:  Anicteric.  CARDIOVASCULAR:  Regular rate and rhythm with no murmurs, rubs or  gallops appreciated.  LUNGS:  Clear to auscultation bilaterally.  ABDOMEN:  Soft, nontender, nondistended.  Positive bowel sounds and no  hepatosplenomegaly.  EXTREMITIES:  No cyanosis, clubbing or edema.   LABORATORY DATA:  Chest x-ray with pulmonary edema and small right  pleural effusion.  Troponin is less than  0.05, CK-MB 3.3.  BNP is 502.  Sodium 142, chloride 109, bicarb 26, BUN 27, creatinine 1.08, glucose  144.  WBC 11.2, hemoglobin 14, platelets 185.   IMPRESSION/PLAN:  1. New congestive heart failure.  Will check an echocardiogram for a      new diagnosis of congestive heart failure.  Will also check serial      cardiac enzymes to rule out any other etiology.  The patient will      receive IV Lasix, and will be converted to p.o. to dose for home      use depending on response and his echocardiogram.  2. Hypertensive urgency.  The patient denies any headache.  Does not      appear to be any end organ failure and not      consistent with an emergent hypertensive emergency.  The patient is      on a nitroglycerin drip.  Will start the patient on p.o. metoprolol      and  titrate it up and wean him off his nitroglycerin drip.  Will      have the patient monitored on telemetry.      Gardiner Barefoot, MD  Electronically Signed     RWC/MEDQ  D:  04/26/2007  T:  04/27/2007  Job:  [id number]

## 2010-09-18 NOTE — Discharge Summary (Signed)
NAMEANGELITO, HOPPING                ACCOUNT NO.:  [id number]   MEDICAL RECORD NO.:  [id number]          PATIENT TYPE:  INP   LOCATION:  2924                         FACILITY:  [facility]   PHYSICIAN:  Richard A. Alanda Amass, M.D.DATE OF BIRTH:  [birth year]   DATE OF ADMISSION:  04/28/2007  DATE OF DISCHARGE:  05/02/2007                               DISCHARGE SUMMARY   ATTENDING PHYSICIAN:  Dr. Alanda Amass.   DISCHARGE DIAGNOSES:  1. New onset CHF (congestive heart failure), significant improvement.  2. Severe nonischemic cardiomyopathy with left ventricular dysfunction      with ejection fraction 20%.  3. Peripheral vascular disease with left renal artery stenosis.  4. Abdominal aortic aneurysm with the size of 5.7/5.0.  The patient      has been seen by Dr. Madilyn Fireman and awaits followup appointment as      outpatient with him.  5. Hypertension.  6. Hypertensive urgency on admission.  7. Hyperlipidemia with elevated LDL.   HISTORY:  This is a 76 year old Caucasian gentleman patient of Dr.  Alanda Amass who presented to [facility] emergency with  complaints of increasing shortness of breath over 3-4 days.  It was  relieved by sitting up and also he had 2 or 3 pillow orthopnea which was  new to the patient.  He denied any chest pain and also reported that he  used his wife's oxygen which provided him with some relief of symptoms.  On admission he was initially seen by [facility] team and then  transferred to our service and initially was seen by Dr. Domingo Sep.  His  cardiac enzymes were slightly abnormal, enzymes slightly abnormal, the  first set showed CK 184, CK-MB 6.3 and troponin 0.10.  Second cardiac  panel showed CK 145, CK-MB 4.0 and troponin 0.09.  The third panel  revealed total CK 135, CK-MB 3.5 and troponin 0.08.  His BNP on  admission was 607.  The patient was given IV diuretic with relief of  symptoms and on the following day on December 23 he underwent coronary  angiography  by Dr. Alanda Amass.  His cath revealed severe LV dysfunction,  EF 15-20%, no significant coronary artery disease but he did have 90-95%  left renal artery stenosis, patent right renal artery and abdominal  aortic aneurysm that was quite significant in size 5.7 x 5.0 cm.   On admission his blood pressure was elevated to 199/123 and medications  were adjusted.   During this admission the patient required dobutamine along with Lasix  and it helped him with diuresis.   Dr. Madilyn Fireman saw the patient this admission after he underwent abdominal  ultrasound and aortogram and he considered him to likely be a candidate  for EVAR.  The patient is to follow up with Dr. Madilyn Fireman in the office  after he underwent abdominal CT.   On December 27, the day of discharge, Dr. Mariah Milling saw the patient and  considered him stable for discharge home.   A 2-D echo was done during this admission and showed EF 15-20%, mild  increased aortic valve thickness with mild calcifications.  He also  had  mild to moderate mitral valve regurgitation.  Right ventricular function  was also mildly reduced and estimated peak right ventricular systolic  pressure was in the range of 35-45 mmHg.   His abdominal ultrasound showed 5.7 x 5.0 cm diameter of abdominal  aortic aneurysm extending approximately 6.1 cm in length with mild  aneurysmal dilatation of common iliac arteries bilaterally.  It was  located in infrarenally.  It also showed no significant abnormalities in  the kidneys and prostatic enlargement.   HOSPITAL LABORATORIES:  BNP on November 26 was 707, magnesium 2.2,  sodium 137, potassium 3.8, chloride 98, CO2 32, glucose 108, BUN 29,  creatinine 1.28, calcium 8.9.  Lipid profile revealed cholesterol 192,  triglycerides 112, cholesterol HDL 43, cholesterol LDL 127.  Liver  function tests were normal except bilirubin was slightly elevated 1.3.   PSA 18.21 which was significantly elevated.   TSH was 3.502.   DISCHARGE  MEDICATIONS:  1. Lasix 40 mg daily.  2. Lisinopril 20 mg daily.  3. Aspirin 81 mg daily.  4. K-Dur 20 mEq daily.  5. Lopressor 75 mg b.i.d.  6. Lipitor 80 mg daily.  7. Plavix 75 mg daily.  8. Zaroxolyn 2.5 mg daily - the patient was instructed to hold this up      until he is seen in the office by Dr. Alanda Amass.  He was not on      Zaroxolyn in the hospital during the last 24 hours.   DISCHARGE DIET:  Low-fat, low-salt, low-cholesterol diet.  Fluid  restriction.   FOLLOWUP:  Dr. Alanda Amass will see the patient in our office in 1-2  weeks.  The office will call with an appointment.  The patient also will  follow up with Dr. Madilyn Fireman and his office will contact the patient to  schedule an appointment.      Raymon Mutton, P.A.      Richard A. Alanda Amass, M.D.  Electronically Signed    MK/MEDQ  D:  05/02/2007  T:  05/03/2007  Job:  [id number]   cc:   Balinda Quails, M.D.  Richard A. Alanda Amass, M.D.

## 2010-09-18 NOTE — Cardiovascular Report (Signed)
NAMEJETTSON, CRABLE                ACCOUNT NO.:  [id number]   MEDICAL RECORD NO.:  [id number]          PATIENT TYPE:  INP   LOCATION:  2908                         FACILITY:  [facility]   PHYSICIAN:  Richard A. Alanda Amass, M.D.DATE OF BIRTH:  11/23/1934   DATE OF PROCEDURE:  04/28/2007  DATE OF DISCHARGE:                            CARDIAC CATHETERIZATION   PROCEDURE PERFORMED:  1. Retrograde central aortic catheterization.  2. Right heart catheterization.  3. Retrograde left heart catheterization.  4. Left ventricular angiogram in the right anterior oblique and left      anterior oblique projections.  5. Thermodilution cardiac output with simultaneous A-PO2 difference.  6. Left coronary angiography via Judkins technique.  7. Sub selective left internal mammary artery.  8. Abdominal aortic angiogram via posteroanterior projection.  9. Right iliac angiogram.   PROCEDURE:  The patient was brought to the second floor CP lab at [facility] on the cardiac monitor in the postictal operative state.  Informed consent was obtained to proceed with diagnostic right and left  heart catheterization and angiography.  Preoperative creatinine was  1.37.  He was carefully hydrated preoperatively.   The right groin was prepped in the __________  usual manner.  Xylocaine  1% was used for local anesthesia.  The RCFA was entered with a single  anterior puncture using an 18 thin-walled needle and the RCFV was also  entered with a single anterior puncture using a thin-walled needle.  Using standard modified Seldinger technique, a 6-French sheath was  placed in the artery and a 7-French short sheath placed in the vein.  Right heart catheterization was done with a 7-French thermodilution  triple-lumen Edward Swan-Ganz catheter, balloon directed.  Left heart  catheterization was done with a 6-French Cordis pigtail catheter.  Simultaneous LV and PCW pressures were recorded along with pullback to  the  PA.  Thermodilution cardiac output and simultaneous A-PO2 difference  was obtained.  LV angiogram was done in the RAO and LAO projection, 24  mL, 12 mL, 14 mL per second and 20 mL, 12 mL per second.  Pullback  pressure was performed showing no gradient across the aortic valve.  An  abdominal aortic angiogram was done in the midstream PA projection with  visualization to the proximal iliacs bilaterally.  Selective coronary  angiography was performed with a 6-French, 4-cm taper, preformed Cordis  coronary catheters and Omnipaque dye.  A sub selective LIMA was done  with hand injection with the right coronary catheter.  Catheters were  removed and right iliac angiography was done retrograde through the side-  arm sheath.  Catheters were removed.  The side-arm sheath was flushed  and there was good puncture into the RCFA and arterial closure was done  with a 6-French Angio-Seal device successfully.  The patient was  transferred to the holding area for venous sheath removal and pressure  hemostasis in stable condition.  Blood pressure was stable during the  procedure.  He was given 40 mg of Lasix IV for severe LV dysfunction  with elevated pulmonary and left heart pressures.  He was also begun  on  intravenous dobutamine for severe LV dysfunction.   PRESSURES:  RA:  29/25; mean 23 mmHg.  RV:  60/10; RVEDP 15 mmHg.  PA:  57/27; mean 40 mmHg.  PCW:  26/27; mean 23 mmHg.  LV:  135/20; LVEDP 26-30 mmHg.  CA:  135/77/mean 105 mmHg.   Arterial blood gases on 02 24% Venturi:  pH of 7.39, pCO2 44, pO2 90.  Aortic saturation 97%, PA saturation 56%.   Thermodilution CO/CI equals 3.30/1.66 M/MIN/meter squared.  Estimated FIC was 2.70/1.36.  PVR equals 436 DSC to the minus fifth; SVR equals 1,915 DSC to the minus  fifth; TSR equals 2,472 DSC to the minus fifth.   There was no gradient across the aortic valve on catheter pullback.   There was no gradient between simultaneous recorded LV and PCW   pressures.   Fluoroscopy revealed 1 plus mitral annular calcification.  There was  only faint left coronary calcification.   LV angiogram in the RAO and LAO projection demonstrated a very severely  global hypokinetic ventricle.  There were no areas of definite akinesis.  Estimated ejection fraction was less than 20% and there was no  significant mitral regurgitation present.  There was no definite  intraventricular dyssynchrony visualized with no IVCD and the patient in  sinus rhythm.   The main left coronary artery was normal.   The left anterior descending artery had no significant stenosis, coursed  the apex of the heart where it bifurcated and was somewhat thin and had  irregularities in the mid and distal portion, but no significant  stenosis.   There was a thin, bifurcating first diagonal after the first septal  perforator at the proximal third of the LAD that had no significant  stenosis.   There was a small DX2 from the mid-LAD that bifurcated and had no  significant stenosis.   The circumflex artery was a dominant vessel.  It gave off a large  bifurcating OM1 with no significant stenosis.  A moderately large OM II  that trifurcated with no significant stenosis in the PDA from the distal  circumflex.  There were irregularities, but no significant stenosis.   The right coronary was a nondominant vessel with no significant stenosis  and predominately RV branches.   There was no left subclavian stenosis or other irregularities.  The left  vertebral was antegrade and LIMA was intact.   Abdominal aortic angiogram demonstrated patent celiac and SMA axis.  The  left renal artery had 90 to 95% concentric stenosis in the proximal  third beyond the about a centimeter beyond the ostia.   The right renal artery was single and had hypodensity in the ostial  portion, but no significant stenosis.   The infrarenal abdominal aorta had eccentric dilatation, although by dye  column  alone, we could not see a definite aneurysm.  There appeared to  be eccentric calcification.  The iliacs were calcified bilaterally.  The  left hypogastric was intact.  The left external iliac was intact, but  because of dye, I could not see whether there was any stenosis.  The  right common iliac had calcific disease without significant stenosis on  retrograde flow and the right hypogastric was intact.  There were  irregularities without high-grade stenosis of the right external iliac.   DISCUSSION:  This 76 year old divorced father of five (he has three  children and he does not know where they are; one lives in [place]  and one lives  in [place]  with 8 grandchildren).  He has a history of  smoking, just discontinued this in August, 2008.  Possible remote TIA  which is not documented.  He has had progressive shortness of breath  since September, 2008 with DOE, shortness of breath at rest, PND and  recent orthopnea.  He has not had chest pain.  Because of worsening  breathing, he was admitted to the hospital on April 26, 2007.  He had  an EKG which showed LVH with secondary ST-T wave abnormality, frequent  ventricular extrasystoles with sinus rhythm.  Abdominal and renal  ultrasound was obtained, presumably secondary to hematuria and this  demonstrated a 5.7 cm x 5.0 cm infrarenal abdominal aortic aneurysm with  mild iliac dilatation bilaterally.  The right kidney is 12, left kidney  11.9.  He was referred for a catheterization evaluation with 2-D echo  showing severe LV dysfunction without significant valvular disease.   Catheterization demonstrates no significant coronary artery disease,  severe global hypokinesis and LV dysfunction with moderately severe  pulmonary hypertension.  In addition, he has high-grade left renal  artery stenosis and infrarenal abdominal aortic aneurysm, although this  is not visualized on angiography.  Medical therapy has just been  instituted pending  his diagnostic evaluation.   RECOMMENDATIONS:  I would recommend that this patient have careful  diuresis, follow up of renal function and optimization of medical  therapy which is new.  His symptoms date back to September and he has a  nonischemic cardiomyopathy with no obvious etiology.  He has high-grade  left renal artery stenosis and would probably benefit from renal artery  intervention in this setting as well.  He will need further evaluation  of his infrarenal abdominal aortic aneurysm.  He will need CT scanning  to assess his infrarenal neck can see if he is a candidate for  endoluminal grafting (ELG).  He is at high risk for any surgical  intervention and would probably best be suited for EVAR.  This was if he  had appropriate anatomy.  He may require further angiographic evaluation  of this as well.  Also depending upon his response to medical therapy  and LV function, he will probably need to be considered for prophylactic  ICD therapy.   CATHETERIZATION DIAGNOSES:  1. Recent-onset congestive heart failure since September, 2008.  2. Nonischemic cardiomyopathy with severe left ventricular      dysfunction, ejection fraction less than 20%.  3. History of systemic hypertension.  4. Possible history of remote transient ischemic attack.  5. Cigarette abuse, chronic obstructive pulmonary disease.      Discontinued smoking in August, 2008.  6. High-grade left renal artery stenosis.  7. Asymptomatic infrarenal abdominal aortic aneurysm to be evaluated      further.  8. Hematuria on Lovenox.  Prostate-specific antigen pending.      Richard A. Alanda Amass, M.D.  Electronically Signed     RAW/MEDQ  D:  04/28/2007  T:  04/29/2007  Job:  [id number]   cc:   Dani Gobble, MD  Records Room  CP Lab

## 2010-09-18 NOTE — Consult Note (Signed)
VASCULAR SURGERY CONSULTATION   [name], [name]  DOB:  05/23/1934                                       05/14/2007  ZOXWR#:[id number]   REASON FOR CONSULTATION:  A 5.5 cm abdominal aortic aneurysm.   HISTORY:  Patient is a 76 year old gentleman recently discharged from  the hospital after an episode of congestive heart failure, who was  discovered to have a large abdominal aortic aneurysm.  He has been free  of any abdominal symptoms or back pain.  He had a CT performed on  December 28th, which does verify this to be 5.5 cm in maximal diameter.   Risk factors for aneurysmal disease include hypertension, male sex, and  tobacco use.   PAST MEDICAL HISTORY:  1. Congestive heart failure.  2. Nonischemic cardiomyopathy.  3. Renal vascular disease with left renal artery stenosis.  4. Hypertension.  5. Hyperlipidemia.   MEDICATIONS:  1. Lasix 40 mg daily.  2. Lisinopril 20 mg daily.  3. Aspirin 81 mg daily.  4. K-Dur 20 mEq daily.  5. Lopressor 75 mg b.i.d.  6. Lipitor 80 mg daily.  7. Plavix 75 mg daily.  8. Zaroxylan 2.5 mg daily (currently on hold).   ALLERGIES:  None known.   FAMILY HISTORY:  Mother died at age 43 of complications of cancer.  Father died at age 38 of COPD.  Brother deceased at age 67 with cerebral  palsy.  One sister living at age 88, generally well.   SOCIAL HISTORY:  The patient is married with five grown children.  He is  retired from [employer], [employer].  He discontinued tobacco use about  six months ago.  Not a regular alcohol user.   REVIEW OF SYSTEMS:  Refer to patient encounter form.  This was reviewed  today.  Positive findings include chest pressure, shortness of breath  with lying flat, and shortness of breath with exertion.  He notes  occasional wheezing.  He does have arthritic and joint discomfort.  Negative complaints include general, GI, [name], vascular, neurologic,  psychiatric, ENT, or hematologic.   PHYSICAL  EXAMINATION:  A 76 year old male alert and oriented.  No acute  distress.  Appears approximately his stated age.  Vital signs:  BP  157/86, pulse 70 per minute, respirations 16 per minute.  HEENT:  Mouth  and throat are clear.  Normocephalic.  Extraocular movements are intact.  Neck:  Supple.  No thyromegaly or adenopathy.  Cardiovascular:  No  carotid bruits.  Normal heart sounds without extra sounds or murmurs.  No gallops or rubs.  Regular rate and rhythm.  Respiratory:  Good air  entry bilaterally.  No rales or rhonchi.  Normal breath sounds.  Abdomen:  Soft and nontender.  AAA palpable.  No organomegaly or other  masses felt.  Normal bowel sounds without bruits.  Lower extremities:  Femoral pulses 2+.  Normal range of motion.  No joint deformity.  Neurologic:  Cranial nerves are intact.  Strength equal bilaterally.  Reflexes are 2+.  Skin:  No ulceration, rash, or skin breakdown.   IMPRESSION:  1. A 5.5 cm abdominal aortic aneurysm.  2. Nonischemic cardiomyopathy.  3. Congestive heart failure.  4. Left renal artery stenosis.  5. Hypertension.  6. Hyperlipidemia.   MEDICAL DECISION MAKING:  Patient with congestive heart failure, now  improved and under control.  Hypertension associated  with left renal  artery stenosis.  May require further treatment with stenting.   CT scan reviewed and patient does have anatomy appropriate for placement  of a potential stent graft.  Will plan sizing and placement once left  renal artery stenting completed.   Balinda Quails, M.D.  Electronically Signed  PGH/MEDQ  D:  05/14/2007  T:  05/15/2007  Job:  [id number]   cc:   [name], M.D.

## 2010-09-21 NOTE — Discharge Summary (Signed)
[name], [name]                ACCOUNT NO.:  [id number]   MEDICAL RECORD NO.:  [id number]          PATIENT TYPE:  INP   LOCATION:  3302                         FACILITY:  [facility]   PHYSICIAN:  Balinda Quails, M.D.    DATE OF BIRTH:  1935/02/10   DATE OF ADMISSION:  08/28/2007  DATE OF DISCHARGE:  08/30/2007                               DISCHARGE SUMMARY   ADMISSION DIAGNOSIS:  A 5.5 cm abdominal aortic aneurysm.   DISCHARGE/SECONDARY DIAGNOSES:  1. A 5.5 cm abdominal aortic aneurysm, status post endovascular      repair.  2. History of congestive heart failure.  3. Nonischemic cardiomyopathy.  4. Renovascular disease, status post left renal artery stenting.  5. Hypertension.  6. Hyperlipidemia.  7. Postoperative hyperglycemia.   ALLERGIES:  No known drug allergies.   CONSULTATIONS:  Richard A. Alanda Amass, MD, Cardiology.   PROCEDURE:  On August 28, 2007, endovascular abdominal aortic aneurysm  repair (Talent 28 x 14 x 140 primary right, 14 x 16 x 90 left).   BRIEF HISTORY:  [name] is a 76 year old male who was found to have a  large abdominal aortic aneurysm, measuring 5.5 cm during recent hospital  admission due to congestive heart failure.  He is treated well with his  congestive heart failure and underwent left renal artery stenting.  He  was seen by Vascular surgeon Dr. Balinda Quails and felt appropriate for  elective repair of his abdominal aortic aneurysm endovascularly.   HOSPITAL COURSE:  [name] was electively admitted to the [facility] on August 28, 2007.  He had a relatively uneventful  postoperative course.  Due to his cardiac history, we did ask Dr.  Alanda Amass to see him postoperatively, but had no specific cardiac issues  or complications postoperatively.  He was felt appropriate to discharge  home on August 30, 2007, and he was tolerating regular diet and had a  postoperative bowel movement, denied abdominal pain and he was  ambulating and  voiding without difficulty.  Vitals were stable and labs  showed a sodium of 138, potassium 3.9, glucose 179, BUN 50, and  creatinine 1.17.  White blood count 12.9, hemoglobin 13.7, hematocrit  40.6, and platelet count 151.   DISPOSITION:  [name] was discharged home in stable condition on  August 30, 2007.   DISCHARGE MEDICATIONS:  1. Lisinopril 60 mg daily.  2. Furosemide 40 mg daily.  3. Plavix 75 mg daily.  4. Metoprolol 50 mg 2 daily.  5. Protonix 40 mg daily.  6. Amlodipine 5 mg daily.  7. Aspirin 81 mg daily.   DISCHARGE INSTRUCTIONS:  Increase activity as tolerated.  Continue heart-  healthy diet.  Clean the incision gently with soap and water.  He will  see Dr. Madilyn Fireman at the [facility] office in approximately 4 weeks with a followup  CT scan.  Our office will contact him regarding specific appointment,  date, and time.      Jerold Coombe, P.A.       ______________________________  Demetrius Charity. Liliane Bade, M.D.    AWZ/MEDQ  D:  11/24/2007  T:  11/25/2007  Job:  [id number]   cc:   Balinda Quails, M.D.

## 2010-09-21 NOTE — Op Note (Signed)
[name], [name]                ACCOUNT NO.:  [id number]   MEDICAL RECORD NO.:  [id number]          PATIENT TYPE:  INP   LOCATION:  3302                         FACILITY:  [facility]   PHYSICIAN:  Janetta Hora. Fields, MD  DATE OF BIRTH:  05/21/1934   DATE OF PROCEDURE:  DATE OF DISCHARGE:  08/30/2007                               OPERATIVE REPORT   PROCEDURE:  Exposure and repair of left common femoral artery for access  for abdominal aortic aneurysm repair.   PREOPERATIVE DIAGNOSIS:  Abdominal aortic aneurysm.   POSTOPERATIVE DIAGNOSIS:  Abdominal aortic aneurysm.   ANESTHESIA:  General.   ASSISTANT:  Liliane Bade, M.D.   INDICATIONS:  The patient is a 76 year old male who requires  endovascular aneurysm repair and requires exposure of the left common  femoral artery for access for this repair.   OPERATIVE DETAILS:  After obtaining informed consent, the patient was  taken to the operating room.  The patient was placed in supine position  on the operating table.  After induction of general anesthesia  endotracheal intubation, Foley catheter and nasogastric tube was placed.  Next, the patient was prepped from the nipples down to the knees.  An  oblique incision was made in the left groin, carried down through the  subcutaneous tissues, down to the level of left common femoral artery.  Common femoral artery is dissected free circumferentially.  Vessel loops  were placed at proximal and distal of the planned site of arteriotomy.  Next, endovascular aneurysm repair was performed of the patient's  aneurysm.  This is dictated as a separate operative note by Dr. Liliane Bade.  After all sheaths, guidewires, and catheters have been removed,  the left common femoral artery was controlled proximally with a Henley  clamp.  It was controlled distally with a vessel loop.  Arteriotomy was  repaired with a running 5-0 Prolene suture.  Just prior to completion  anastomosis, blood was thoroughly  flushed.  Anastomosis was secured.  Vascular control was released.  There was good pulse above and below the  level of repair immediately.  The foot was also pink and warm at this  point.  Next, the deep layers were closed with running 2-0 Vicryl  suture.  The superficial layer closed with running 3-0 Vicryl suture.  Skin was closed with a running 4-0 Monocryl stitch.  The patient  tolerated the procedure well and there were no immediate complications.  Instrument, sponge, and needle counts were correct at the end of case.  The patient was taken to the recovery room in stable condition.      Janetta Hora. Fields, MD  Electronically Signed     CEF/MEDQ  D:  09/01/2007  T:  09/02/2007  Job:  [id number]

## 2011-01-24 LAB — URINALYSIS, ROUTINE W REFLEX MICROSCOPIC
Nitrite: NEGATIVE
Protein, ur: NEGATIVE
Specific Gravity, Urine: 1.012
Urobilinogen, UA: 0.2

## 2011-01-24 LAB — APTT: aPTT: 26

## 2011-01-24 LAB — CBC
Hemoglobin: 15.4
MCHC: 32.9
RBC: 5.32
WBC: 10.3

## 2011-01-24 LAB — BASIC METABOLIC PANEL
Calcium: 8.9
Creatinine, Ser: 1.26
GFR calc Af Amer: >60
GFR calc non Af Amer: 57 — ABNORMAL LOW
Sodium: 143

## 2011-01-24 LAB — PROTIME-INR: INR: 0.9

## 2011-01-29 LAB — BLOOD GAS, ARTERIAL
Bicarbonate: 26 — ABNORMAL HIGH
FIO2: 0.21
Patient temperature: 98.6
pCO2 arterial: 41.1
pH, Arterial: 7.418
pO2, Arterial: 88.8

## 2011-01-29 LAB — TYPE AND SCREEN: Antibody Screen: NEGATIVE

## 2011-01-29 LAB — COMPREHENSIVE METABOLIC PANEL
AST: 17
Albumin: 3.5
Alkaline Phosphatase: 61
BUN: 15
CO2: 23
Chloride: 108
Potassium: 4.1
Total Bilirubin: 0.4

## 2011-01-29 LAB — CBC
HCT: 45
Hemoglobin: 13.7
Platelets: 187
RBC: 4.62
RBC: 5.13
RDW: 15.3
WBC: 12.9 — ABNORMAL HIGH
WBC: 8.4

## 2011-01-29 LAB — URINALYSIS, ROUTINE W REFLEX MICROSCOPIC
Bilirubin Urine: NEGATIVE
Hgb urine dipstick: NEGATIVE
Specific Gravity, Urine: 1.016
pH: 6.5

## 2011-01-29 LAB — BASIC METABOLIC PANEL
Calcium: 8.3 — ABNORMAL LOW
GFR calc Af Amer: >60
GFR calc non Af Amer: >60
Sodium: 138

## 2011-01-29 LAB — ABO/RH: ABO/RH(D): B POS

## 2011-02-08 LAB — CBC
HCT: 41.3
HCT: 43
HCT: 44.2
Hemoglobin: 13.7
Hemoglobin: 13.8
Hemoglobin: 14.6
Hemoglobin: 15
MCHC: 33.1
MCHC: 33.2
MCHC: 34
MCV: 88.9
MCV: 89.1
MCV: 88.4
Platelets: 175
RBC: 4.68
RBC: 4.79
RBC: 4.87
RBC: 5
RDW: 14.9
RDW: 15.1
WBC: 10.8 — ABNORMAL HIGH
WBC: 11.2 — ABNORMAL HIGH

## 2011-02-08 LAB — URINALYSIS, ROUTINE W REFLEX MICROSCOPIC
Nitrite: NEGATIVE
Specific Gravity, Urine: 1.011
Urobilinogen, UA: 0.2

## 2011-02-08 LAB — CARDIAC PANEL(CRET KIN+CKTOT+MB+TROPI)
CK, MB: 4.2 — ABNORMAL HIGH
Relative Index: 2.6 — ABNORMAL HIGH
Relative Index: 3.7 — ABNORMAL HIGH
Total CK: 121
Total CK: 141
Total CK: 145
Troponin I: 0.08 — ABNORMAL HIGH
Troponin I: 0.09 — ABNORMAL HIGH
Troponin I: 0.1 — ABNORMAL HIGH

## 2011-02-08 LAB — DIFFERENTIAL
Basophils Absolute: 0
Basophils Absolute: 0
Basophils Relative: 1
Eosinophils Absolute: 0.1 — ABNORMAL LOW
Lymphocytes Relative: 11 — ABNORMAL LOW
Lymphs Abs: 1.2
Monocytes Absolute: 0.7
Monocytes Relative: 9
Neutro Abs: 6.5
Neutro Abs: 9.2 — ABNORMAL HIGH
Neutrophils Relative %: 69

## 2011-02-08 LAB — BASIC METABOLIC PANEL
BUN: 29 — ABNORMAL HIGH
CO2: 30
CO2: 30
CO2: 30
Calcium: 8.4
Calcium: 8.7
Calcium: 8.8
Chloride: 102
Chloride: 98
Chloride: 105
Creatinine, Ser: 1.28
GFR calc Af Amer: >60
GFR calc Af Amer: >60
GFR calc Af Amer: >60
GFR calc non Af Amer: >60
Glucose, Bld: 108 — ABNORMAL HIGH
Glucose, Bld: 96
Potassium: 3.7
Potassium: 4
Sodium: 139
Sodium: 140
Sodium: 142
Sodium: 144

## 2011-02-08 LAB — B-NATRIURETIC PEPTIDE (CONVERTED LAB)
Pro B Natriuretic peptide (BNP): 502 — ABNORMAL HIGH
Pro B Natriuretic peptide (BNP): 607 — ABNORMAL HIGH
Pro B Natriuretic peptide (BNP): 824 — ABNORMAL HIGH

## 2011-02-08 LAB — URINE CULTURE: Culture: NO GROWTH

## 2011-02-08 LAB — POCT CARDIAC MARKERS
CKMB, poc: 2.4
CKMB, poc: 3.3
Myoglobin, poc: 118
Troponin i, poc: <0.05
Troponin i, poc: <0.05

## 2011-02-08 LAB — COMPREHENSIVE METABOLIC PANEL
ALT: 19
AST: 14
Albumin: 3.4 — ABNORMAL LOW
Alkaline Phosphatase: 60
Alkaline Phosphatase: 68
BUN: 39 — ABNORMAL HIGH
CO2: 31
CO2: 28
Chloride: 103
Creatinine, Ser: 1.37
GFR calc Af Amer: >60
GFR calc non Af Amer: 56 — ABNORMAL LOW
GFR calc non Af Amer: 51 — ABNORMAL LOW
Glucose, Bld: 99
Potassium: 3.6
Potassium: 4.2
Sodium: 138
Total Bilirubin: 1.2
Total Protein: 5.9 — ABNORMAL LOW

## 2011-02-08 LAB — PROTIME-INR: INR: 1.1

## 2011-02-08 LAB — MAGNESIUM
Magnesium: 2.2
Magnesium: 2.2

## 2011-02-08 LAB — LIPID PANEL
LDL Cholesterol: 127 — ABNORMAL HIGH
Triglycerides: 112
VLDL: 22

## 2011-02-08 LAB — URINE MICROSCOPIC-ADD ON

## 2011-02-08 LAB — POCT I-STAT 3, ART BLOOD GAS (G3+)
Bicarbonate: 26.6 — ABNORMAL HIGH
TCO2: 28
pCO2 arterial: 43.7
pH, Arterial: 7.392

## 2011-02-08 LAB — PSA: PSA: 18.21 — ABNORMAL HIGH

## 2011-02-08 LAB — CK TOTAL AND CKMB (NOT AT ARMC)
CK, MB: 6.3 — ABNORMAL HIGH
Relative Index: 3.4 — ABNORMAL HIGH
Total CK: 184

## 2011-02-08 LAB — POCT I-STAT 3, VENOUS BLOOD GAS (G3P V)
Acid-Base Excess: 3 — ABNORMAL HIGH
Bicarbonate: 29.7 — ABNORMAL HIGH
Operator id: 250901
pO2, Ven: 31

## 2011-02-08 LAB — APTT: aPTT: 26

## 2011-02-08 LAB — TSH: TSH: 3.502

## 2012-05-13 ENCOUNTER — Emergency Department (HOSPITAL_COMMUNITY)
Admission: EM | Admit: 2012-05-13 | Discharge: 2012-05-13 | Disposition: A | Discharge status: Home / Self Care | Payer: Medicare Other | Attending: Emergency Medicine | Admitting: Emergency Medicine

## 2012-05-13 ENCOUNTER — Encounter (HOSPITAL_COMMUNITY): Payer: Self-pay | Admitting: Family Medicine

## 2012-05-13 ENCOUNTER — Emergency Department (INDEPENDENT_AMBULATORY_CARE_PROVIDER_SITE_OTHER): Payer: Medicare Other

## 2012-05-13 ENCOUNTER — Encounter (HOSPITAL_COMMUNITY): Payer: Self-pay | Admitting: *Deleted

## 2012-05-13 ENCOUNTER — Emergency Department (HOSPITAL_COMMUNITY)
Admission: EM | Admit: 2012-05-13 | Discharge: 2012-05-13 | Disposition: A | Discharge status: Nursing Home (Includes ICF) | Payer: Medicare Other | Source: Home / Self Care | Attending: Family Medicine | Admitting: Family Medicine

## 2012-05-13 DIAGNOSIS — J988 Other specified respiratory disorders: Secondary | ICD-10-CM

## 2012-05-13 DIAGNOSIS — R0602 Shortness of breath: Secondary | ICD-10-CM

## 2012-05-13 DIAGNOSIS — I509 Heart failure, unspecified: Secondary | ICD-10-CM | POA: Insufficient documentation

## 2012-05-13 DIAGNOSIS — I10 Essential (primary) hypertension: Secondary | ICD-10-CM | POA: Insufficient documentation

## 2012-05-13 DIAGNOSIS — J189 Pneumonia, unspecified organism: Secondary | ICD-10-CM

## 2012-05-13 DIAGNOSIS — R062 Wheezing: Secondary | ICD-10-CM | POA: Insufficient documentation

## 2012-05-13 DIAGNOSIS — R059 Cough, unspecified: Secondary | ICD-10-CM | POA: Insufficient documentation

## 2012-05-13 DIAGNOSIS — R05 Cough: Secondary | ICD-10-CM

## 2012-05-13 HISTORY — DX: Essential (primary) hypertension: I10

## 2012-05-13 HISTORY — DX: Heart failure, unspecified: I50.9

## 2012-05-13 LAB — CBC WITH DIFFERENTIAL/PLATELET
Eosinophils Relative: 1 % (ref 0–5)
Hemoglobin: 16.2 g/dL (ref 13.0–17.0)
Lymphocytes Relative: 15 % (ref 12–46)
Lymphs Abs: 1.5 10*3/uL (ref 0.7–4.0)
MCV: 88.5 fL (ref 78.0–100.0)
Monocytes Relative: 10 % (ref 3–12)
Neutrophils Relative %: 74 % (ref 43–77)
Platelets: 167 10*3/uL (ref 150–400)
RBC: 5.74 MIL/uL (ref 4.22–5.81)
WBC: 10.1 10*3/uL (ref 4.0–10.5)

## 2012-05-13 LAB — COMPREHENSIVE METABOLIC PANEL
ALT: 7 U/L (ref 0–53)
Alkaline Phosphatase: 74 U/L (ref 39–117)
BUN: 32 mg/dL — ABNORMAL HIGH (ref 6–23)
CO2: 33 mEq/L — ABNORMAL HIGH (ref 19–32)
GFR calc Af Amer: 62 mL/min — ABNORMAL LOW (ref >90)
GFR calc non Af Amer: 54 mL/min — ABNORMAL LOW (ref >90)
Glucose, Bld: 119 mg/dL — ABNORMAL HIGH (ref 70–99)
Potassium: 3.6 mEq/L (ref 3.5–5.1)
Sodium: 141 mEq/L (ref 135–145)

## 2012-05-13 LAB — POCT I-STAT TROPONIN I

## 2012-05-13 MED ORDER — ALBUTEROL SULFATE (5 MG/ML) 0.5% IN NEBU
2.5000 mg | INHALATION_SOLUTION | RESPIRATORY_TRACT | Status: DC | PRN
  Administered 2012-05-13 (×2): 2.5 mg via RESPIRATORY_TRACT
  Filled 2012-05-13 (×2): qty 0.5

## 2012-05-13 MED ORDER — HYDRALAZINE HCL 20 MG/ML IJ SOLN: 10.0000 mg | Freq: Once | INTRAMUSCULAR | Status: DC

## 2012-05-13 MED ORDER — PREDNISONE 20 MG PO TABS: 40.0000 mg | ORAL_TABLET | Freq: Every day | ORAL | Status: DC

## 2012-05-13 MED ORDER — AZITHROMYCIN 250 MG PO TABS
500.0000 mg | ORAL_TABLET | Freq: Once | ORAL | Status: AC
  Administered 2012-05-13: 500 mg via ORAL
  Filled 2012-05-13: qty 2

## 2012-05-13 MED ORDER — BENZONATATE 100 MG PO CAPS: 100.0000 mg | ORAL_CAPSULE | Freq: Three times a day (TID) | ORAL | Status: AC | PRN

## 2012-05-13 MED ORDER — AZITHROMYCIN 250 MG PO TABS: ORAL_TABLET | ORAL | Status: DC

## 2012-05-13 MED ORDER — PREDNISONE 20 MG PO TABS
60.0000 mg | ORAL_TABLET | Freq: Once | ORAL | Status: AC
  Administered 2012-05-13: 60 mg via ORAL
  Filled 2012-05-13: qty 3

## 2012-05-13 MED ORDER — CARVEDILOL 25 MG PO TABS
25.0000 mg | ORAL_TABLET | Freq: Once | ORAL | Status: DC
  Filled 2012-05-13: qty 1

## 2012-05-13 MED ORDER — IPRATROPIUM BROMIDE 0.02 % IN SOLN
0.5000 mg | RESPIRATORY_TRACT | Status: DC | PRN
  Administered 2012-05-13 (×2): 0.5 mg via RESPIRATORY_TRACT
  Filled 2012-05-13 (×2): qty 2.5

## 2012-05-13 MED ORDER — ALBUTEROL SULFATE HFA 108 (90 BASE) MCG/ACT IN AERS: 2.0000 | INHALATION_SPRAY | RESPIRATORY_TRACT | Status: AC | PRN

## 2012-05-13 NOTE — ED Notes (Signed)
Pt reports cough , congestion and worse than normal shortness of breath, states that he is coughing up gray sputum

## 2012-05-13 NOTE — ED Notes (Signed)
[facility] called for transfer 

## 2012-05-13 NOTE — ED Provider Notes (Signed)
History     CSN: [id number]  Arrival date & time 05/13/12  1506   None     Chief Complaint  Patient presents with  . Shortness of Breath    (Consider location/radiation/quality/duration/timing/severity/associated sxs/prior treatment) HPI chief complaint: Productive cough and shortness of breath. Onset: Today. Location: Chest. Not improved or worsened by anything. Severity: Mild. Timing: Constant. Context: Sent from urgent care for possible pneumonia. For signs and symptoms the review of systems. Regarding social history see nurse's notes. Patient stop smoking 5 years ago. No family history of flulike illnesses. I have reviewed patient's past medical, past surgical, social history as well as medications and allergies.  Past Medical History  Diagnosis Date  . CHF (congestive heart failure)   . Hypertension     History reviewed. No pertinent past surgical history.  History reviewed. No pertinent family history.  History  Substance Use Topics  . Smoking status: Never Smoker   . Smokeless tobacco: Not on file  . Alcohol Use: No      Review of Systems  Constitutional: Negative for fever and chills.  HENT: Negative for hearing loss, ear pain, congestion, sore throat, facial swelling, rhinorrhea, trouble swallowing, neck pain, neck stiffness, voice change, sinus pressure and ear discharge.   Eyes: Negative for visual disturbance.  Respiratory: Positive for cough and shortness of breath. Negative for chest tightness, wheezing and stridor.   Cardiovascular: Negative for chest pain, palpitations and leg swelling.  Gastrointestinal: Negative for nausea, vomiting, abdominal pain, diarrhea, constipation, blood in stool and abdominal distention.  Genitourinary: Negative for dysuria, urgency, hematuria and difficulty urinating.  Musculoskeletal: Negative for back pain and gait problem.  Skin: Negative for rash.  Neurological: Negative for dizziness, tremors, seizures, syncope, facial  asymmetry, speech difficulty, weakness, light-headedness, numbness and headaches.  Hematological: Negative for adenopathy. Does not bruise/bleed easily.  Psychiatric/Behavioral: Negative for confusion.    Allergies  Review of patient's allergies indicates no known allergies.  Home Medications  No current outpatient prescriptions on file.  BP 185/64  Pulse 38  Temp 99.5 F (37.5 C) (Oral)  Resp 22  SpO2 93%  Physical Exam  Constitutional: He is oriented to person, place, and time. He appears well-developed and well-nourished. No distress.  HENT:  Head: Normocephalic and atraumatic.  Eyes: Conjunctivae normal are normal.  Neck: Normal range of motion. Neck supple. No JVD present.  Cardiovascular: Normal rate, regular rhythm, normal heart sounds and intact distal pulses.   No murmur heard. Pulmonary/Chest: Effort normal. No accessory muscle usage or stridor. Not tachypneic. No respiratory distress. He has decreased breath sounds in the right upper field, the right middle field, the right lower field, the left upper field, the left middle field and the left lower field. He has wheezes in the right upper field, the right middle field, the right lower field, the left upper field, the left middle field and the left lower field. He has no rhonchi. He has no rales. He exhibits no tenderness.       Faint diffuse expiratory wheezing.  Abdominal: Soft. Bowel sounds are normal. He exhibits no distension and no mass. There is no tenderness. There is no rebound and no guarding.  Musculoskeletal: Normal range of motion. He exhibits no edema and no tenderness.  Neurological: He is alert and oriented to person, place, and time.  Skin: Skin is warm and dry. He is not diaphoretic.  Psychiatric: He has a normal mood and affect.    ED Course  Procedures (including critical  care time)  Labs Reviewed  COMPREHENSIVE METABOLIC PANEL - Abnormal; Notable for the following:    CO2 33 (*)     Glucose, Bld  119 (*)     BUN 32 (*)     GFR calc non Af Amer 54 (*)     GFR calc Af Amer 62 (*)     All other components within normal limits  PRO B NATRIURETIC PEPTIDE - Abnormal; Notable for the following:    Pro B Natriuretic peptide (BNP) 602.6 (*)     All other components within normal limits  CBC WITH DIFFERENTIAL  MAGNESIUM  INFLUENZA PANEL BY PCR  POCT I-STAT TROPONIN I   Dg Chest 2 View  05/13/2012  *RADIOLOGY REPORT*  Clinical Data: Shortness of breath and cough.  Chest tingling and congestion.  CHEST - 2 VIEW  Comparison: CT chest 06/28/2010 and chest radiograph 07/02/2009.  Findings: Trachea is midline.  Heart size normal.  On the frontal view, the right hilum appears mildly prominent.  On the lateral view, there may be added density in the infrahilar region.  Lungs are hyperinflated but otherwise clear.  No pleural fluid. Degenerative changes are seen in the spine.  IMPRESSION: Possible right infrahilar density, not well seen on the prior examinations.  Non emergent CT chest without contrast would be helpful in further evaluation, as clinically indicated. These results will be called to the ordering clinician or representative by the Radiologist Assistant, and communication documented in the PACS Dashboard.   Original Report Authenticated By: Leanna Battles, M.D.      1. Cough   2. SOB (shortness of breath)   3. Wheezing-associated respiratory infection (WARI)   4. Community acquired pneumonia      EKG reviewed and interpreted: Normal sinus rhythm rate 99. Indeterminate axis. Normal intervals. Intermittent multifocal PVCs. T wave inversions in the lateral leads similar to prior. No ST segment elevation. MDM  Patient is a well-appearing 78 year old male sent from urgent care today with several hours of cough shortness of breath. Picture concerning for pneumonia. Patient does have diminished breath sounds and mild wheezing expiratory on exam. No history of COPD but longtime smoker who quit 5  years ago. Possible undiagnosed COPD versus upper respiratory infection with wheezing. Symptoms improved with duonebs. By mouth prednisone provided here. Given chest x-ray urgent care shows a likely pneumonia patient was covered with azithromycin in the emergency department and discharged with instructions to take for 4 days. Patient was ambulated twice with no desaturation or dyspnea. Breath sounds improved with breathing treatments. The patient has a documented pulse of 38 but the monitor recorded heart rate of 92 at the identical time. No bradycardia noted in the emergency department. No chest pain. Symptoms are more consistent with an infectious process. Acute coronary syndrome is doubtful at this time. Pulmonary and wasn't is doubtful. Patient given home quarter dose with improvement in his blood pressure.        Consuello Masse, MD 05/14/12 [phone]

## 2012-05-13 NOTE — ED Notes (Signed)
Per [facility], pt sent here from [facility] with increased SOB, rhonchi and abnormal chest xray

## 2012-05-13 NOTE — ED Provider Notes (Signed)
History     CSN: [id number]  Arrival date & time 05/13/12  1131   First MD Initiated Contact with Patient 05/13/12 1228      Chief Complaint  Patient presents with  . URI    (Consider location/radiation/quality/duration/timing/severity/associated sxs/prior treatment) HPI Comments: 78 year old male former smoker with history of cardiomyopathy, CHF and hypertension. Here complaining of productive cough and shortness of breath exacerbation. No fever. Reports chest discomfort only with coughing. No vomiting or diarrhea. Patient never used an inhaler at home he is on no home oxygen. State he is able to walk long distances with slow pace but has been very short of breath today with minimal exertion[place] Denies fever, Denies low extremity edema. Patient did not bring home medications today although reports that he has taking his blood pressure medication except for Lasix. Wife has also been sick with GI symptoms nausea vomiting and diarrhea.   Past Medical History  Diagnosis Date  . CHF (congestive heart failure)   . Hypertension     History reviewed. No pertinent past surgical history.  Family History  Problem Relation Age of Onset  . Family history unknown: Yes    History  Substance Use Topics  . Smoking status: Never Smoker   . Smokeless tobacco: Not on file  . Alcohol Use: No      Review of Systems  Constitutional: Negative for fever, chills and diaphoresis.  Respiratory: Positive for cough, shortness of breath and wheezing.   Cardiovascular: Negative for leg swelling.  Gastrointestinal: Negative for nausea, vomiting, abdominal pain and diarrhea.  Neurological: Negative for dizziness and headaches.    Allergies  Review of patient's allergies indicates no known allergies.  Home Medications  No current outpatient prescriptions on file.  BP 166/61  Pulse 48  Temp 98.2 F (36.8 C) (Oral)  Resp 16  SpO2 94%  Physical Exam  Nursing note and  vitals reviewed. Constitutional: He is oriented to person, place, and time. He appears well-developed and well-nourished. No distress.       Appears calm and comfortable, although become short of breath with talking.  HENT:  Head: Normocephalic and atraumatic.  Mouth/Throat: No oropharyngeal exudate.  Eyes: Conjunctivae normal are normal. Right eye exhibits no discharge. Left eye exhibits no discharge. No scleral icterus.  Neck: No JVD present.  Cardiovascular: Exam reveals no gallop and no friction rub.        Irregular rhythm. Frequent isolated extrasystoles. No low extremity edema  Pulmonary/Chest:       Bronchitic cough, generalized rattling, bilateral rhonchi, prolonged expiration and impress crackles in both bases worse on the left base. No tachypnea or orthopnea.  Abdominal: Soft. He exhibits no distension. There is no tenderness.       Obese.  Lymphadenopathy:    He has no cervical adenopathy.  Neurological: He is alert and oriented to person, place, and time.  Skin: No rash noted. He is not diaphoretic.    ED Course  Procedures (including critical care time)  Labs Reviewed - No data to display Dg Chest 2 View  05/13/2012  *RADIOLOGY REPORT*  Clinical Data: Shortness of breath and cough.  Chest tingling and congestion.  CHEST - 2 VIEW  Comparison: CT chest 06/28/2010 and chest radiograph 07/02/2009.  Findings: Trachea is midline.  Heart size normal.  On the frontal view, the right hilum appears mildly prominent.  On the lateral view, there may be added density in the infrahilar region.  Lungs are hyperinflated  but otherwise clear.  No pleural fluid. Degenerative changes are seen in the spine.  IMPRESSION: Possible right infrahilar density, not well seen on the prior examinations.  Non emergent CT chest without contrast would be helpful in further evaluation, as clinically indicated. These results will be called to the ordering clinician or representative by the Radiologist Assistant,  and communication documented in the PACS Dashboard.   Original Report Authenticated By: Leanna Battles, M.D.      1. Shortness of breath     EKG: difficult study due to chest congestion. Sinus rhythm,  frequent PVC's , ventricular rate 93.  Nonspecific ST and T changes ( ST and T changes present prior EKGs)   MDM  In the78 year old male former smoker with history of cardiomyopathy, CHF and hypertension. Here complaining of productive cough and shortness of breath exacerbation. On exam:  Afebrile, Bradycardia with a heart rate in the 80-90's, wide differential blood pressure with a systolic 166/60 diastolic. Bronchitic cough, prolonged expiration with generalized bilateral rhonchi crackles on the left base. No low extremity edema. On x-ray: Bilateral hyperinflation. Density on the right infra-hilar area otherwise no pulmonary edema.impress patient's symptoms related to bronchitis/COPD exacerbation, versus cardiac origin (CHF). Decided to transfer to the emergency department for further evaluation and management.  Sharin Grave, MD 05/13/12 [phone]

## 2012-05-14 NOTE — ED Provider Notes (Signed)
I have personally seen and examined the patient.  I have discussed the plan of care with the resident.  I have reviewed the documentation on PMH/FH/Soc. History.  I have reviewed the documentation of the resident and agree.  I have reviewed and agree with the ECG interpretation(s) documented by the resident.  Pt well appearing.  Improved with treatment in the ED.  I feel he is safe for outpatient management.    Joya Gaskins, MD 05/14/12 2340

## 2012-08-20 ENCOUNTER — Other Ambulatory Visit (HOSPITAL_COMMUNITY): Payer: Self-pay | Admitting: Cardiovascular Disease

## 2012-08-20 DIAGNOSIS — I10 Essential (primary) hypertension: Secondary | ICD-10-CM

## 2012-08-20 DIAGNOSIS — I428 Other cardiomyopathies: Secondary | ICD-10-CM

## 2012-08-20 DIAGNOSIS — I509 Heart failure, unspecified: Secondary | ICD-10-CM

## 2012-09-07 ENCOUNTER — Ambulatory Visit (HOSPITAL_COMMUNITY)
Admission: RE | Admit: 2012-09-07 | Discharge: 2012-09-07 | Disposition: A | Discharge status: Home / Self Care | Payer: Medicare Other | Source: Ambulatory Visit | Attending: Cardiovascular Disease | Admitting: Cardiovascular Disease

## 2012-09-07 DIAGNOSIS — I509 Heart failure, unspecified: Secondary | ICD-10-CM | POA: Insufficient documentation

## 2012-09-07 DIAGNOSIS — I428 Other cardiomyopathies: Secondary | ICD-10-CM

## 2012-09-07 DIAGNOSIS — I10 Essential (primary) hypertension: Secondary | ICD-10-CM

## 2012-09-07 NOTE — Progress Notes (Signed)
2D Echo Performed 09/07/2012    Jak Haggar, RCS  

## 2013-01-06 ENCOUNTER — Other Ambulatory Visit: Payer: Self-pay | Admitting: *Deleted

## 2013-01-06 MED ORDER — CHLORTHALIDONE 25 MG PO TABS: 12.5000 mg | ORAL_TABLET | Freq: Every day | ORAL | Status: AC

## 2013-01-06 NOTE — Telephone Encounter (Signed)
rx called. Pt aware.

## 2013-01-21 ENCOUNTER — Other Ambulatory Visit (HOSPITAL_COMMUNITY): Payer: Self-pay | Admitting: Cardiovascular Disease

## 2013-01-21 DIAGNOSIS — R0989 Other specified symptoms and signs involving the circulatory and respiratory systems: Secondary | ICD-10-CM

## 2013-01-21 DIAGNOSIS — Z8679 Personal history of other diseases of the circulatory system: Secondary | ICD-10-CM

## 2013-01-28 ENCOUNTER — Encounter: Payer: Self-pay | Admitting: Cardiovascular Disease

## 2013-02-09 ENCOUNTER — Inpatient Hospital Stay (HOSPITAL_COMMUNITY): Admission: RE | Admit: 2013-02-09 | Payer: Medicare Other | Source: Ambulatory Visit

## 2013-02-17 ENCOUNTER — Encounter (HOSPITAL_COMMUNITY): Payer: Medicare Other

## 2013-06-01 ENCOUNTER — Telehealth (HOSPITAL_COMMUNITY): Payer: Self-pay | Admitting: *Deleted

## 2013-06-15 ENCOUNTER — Telehealth (HOSPITAL_COMMUNITY): Payer: Self-pay | Admitting: *Deleted

## 2014-11-17 ENCOUNTER — Encounter: Payer: Self-pay | Admitting: Cardiovascular Disease

## 2015-01-16 ENCOUNTER — Telehealth (HOSPITAL_COMMUNITY): Payer: Self-pay

## 2015-01-16 NOTE — Telephone Encounter (Signed)
I have called and left a message with [facility] to inquire about participation in Pulmonary Rehab. Will send letter in mail and follow up.

## 2015-01-27 ENCOUNTER — Emergency Department (HOSPITAL_COMMUNITY)
Admission: EM | Admit: 2015-01-27 | Discharge: 2015-01-28 | Disposition: A | Discharge status: Home / Self Care | Payer: Medicare Other | Attending: Emergency Medicine | Admitting: Emergency Medicine

## 2015-01-27 ENCOUNTER — Encounter (HOSPITAL_COMMUNITY): Payer: Self-pay | Admitting: Emergency Medicine

## 2015-01-27 DIAGNOSIS — R630 Anorexia: Secondary | ICD-10-CM | POA: Insufficient documentation

## 2015-01-27 DIAGNOSIS — Z79899 Other long term (current) drug therapy: Secondary | ICD-10-CM | POA: Insufficient documentation

## 2015-01-27 DIAGNOSIS — Z7982 Long term (current) use of aspirin: Secondary | ICD-10-CM | POA: Diagnosis not present

## 2015-01-27 DIAGNOSIS — I509 Heart failure, unspecified: Secondary | ICD-10-CM | POA: Insufficient documentation

## 2015-01-27 DIAGNOSIS — R112 Nausea with vomiting, unspecified: Secondary | ICD-10-CM | POA: Insufficient documentation

## 2015-01-27 DIAGNOSIS — I10 Essential (primary) hypertension: Secondary | ICD-10-CM | POA: Insufficient documentation

## 2015-01-27 LAB — URINALYSIS, ROUTINE W REFLEX MICROSCOPIC
Bilirubin Urine: NEGATIVE
Glucose, UA: 100 mg/dL — AB
Ketones, ur: 40 mg/dL — AB
LEUKOCYTES UA: NEGATIVE
NITRITE: NEGATIVE
PH: 7 (ref 5.0–8.0)
Protein, ur: >300 mg/dL — AB
SPECIFIC GRAVITY, URINE: 1.015 (ref 1.005–1.030)
Urobilinogen, UA: 1 mg/dL (ref 0.0–1.0)

## 2015-01-27 LAB — CBC
HEMATOCRIT: 51.3 % (ref 39.0–52.0)
Hemoglobin: 17.3 g/dL — ABNORMAL HIGH (ref 13.0–17.0)
MCH: 29.1 pg (ref 26.0–34.0)
MCHC: 33.7 g/dL (ref 30.0–36.0)
MCV: 86.4 fL (ref 78.0–100.0)
Platelets: 181 10*3/uL (ref 150–400)
RBC: 5.94 MIL/uL — AB (ref 4.22–5.81)
RDW: 13.9 % (ref 11.5–15.5)
WBC: 11.6 10*3/uL — AB (ref 4.0–10.5)

## 2015-01-27 LAB — COMPREHENSIVE METABOLIC PANEL
ALBUMIN: 4.2 g/dL (ref 3.5–5.0)
ALK PHOS: 88 U/L (ref 38–126)
ALT: 12 U/L — AB (ref 17–63)
AST: 20 U/L (ref 15–41)
Anion gap: 9 (ref 5–15)
BILIRUBIN TOTAL: 1 mg/dL (ref 0.3–1.2)
BUN: 16 mg/dL (ref 6–20)
CALCIUM: 9.4 mg/dL (ref 8.9–10.3)
CO2: 27 mmol/L (ref 22–32)
Chloride: 104 mmol/L (ref 101–111)
Creatinine, Ser: 0.66 mg/dL (ref 0.61–1.24)
GFR calc Af Amer: >60 mL/min (ref >60)
GFR calc non Af Amer: >60 mL/min (ref >60)
GLUCOSE: 157 mg/dL — AB (ref 65–99)
Potassium: 4 mmol/L (ref 3.5–5.1)
Sodium: 140 mmol/L (ref 135–145)
TOTAL PROTEIN: 7.3 g/dL (ref 6.5–8.1)

## 2015-01-27 LAB — LIPASE, BLOOD: Lipase: 22 U/L (ref 22–51)

## 2015-01-27 LAB — URINE MICROSCOPIC-ADD ON

## 2015-01-27 MED ORDER — PROMETHAZINE HCL 25 MG/ML IJ SOLN
6.2500 mg | Freq: Once | INTRAMUSCULAR | Status: AC
  Administered 2015-01-27: 6.25 mg via INTRAVENOUS
  Filled 2015-01-27: qty 1

## 2015-01-27 MED ORDER — ONDANSETRON HCL 4 MG/2ML IJ SOLN
4.0000 mg | Freq: Once | INTRAMUSCULAR | Status: AC
  Administered 2015-01-27: 4 mg via INTRAVENOUS
  Filled 2015-01-27: qty 2

## 2015-01-27 MED ORDER — SODIUM CHLORIDE 0.9 % IV BOLUS (SEPSIS)
500.0000 mL | Freq: Once | INTRAVENOUS | Status: AC
  Administered 2015-01-27: 500 mL via INTRAVENOUS

## 2015-01-27 NOTE — Progress Notes (Signed)
Patient confirms his pcp is Dr. Daphane Shepherd in [place] [phone].  System updated.

## 2015-01-27 NOTE — ED Notes (Addendum)
Delay in lab draw pt in bathroom 

## 2015-01-27 NOTE — ED Notes (Signed)
MD at bedside. 

## 2015-01-27 NOTE — ED Notes (Signed)
Per EMS pt. From home with complaint of nausea and vomiting which started at 8am today, also reported of 5x diarrhea which also started at 8am , abdominal pai at 3/10.  Alert and oriented x3.

## 2015-01-27 NOTE — ED Provider Notes (Signed)
CSN: [id number]     Arrival date & time 01/27/15  1925 History   First MD Initiated Contact with Patient 01/27/15 2001     Chief Complaint  Patient presents with  . Nausea  . Vomiting     (Consider location/radiation/quality/duration/timing/severity/associated sxs/prior Treatment) The history is provided by the patient.   patient developed nausea and vomiting this morning. States first his stomach felt a little sick with any developed nausea vomiting. No diarrhea but states he has had bowel movements around 5 times a day. Mild abdominal pain. States he has not been very hungry. States he has just vomited stomach contents. No blood. No fevers or chills but states he got a little sweaty in the midst of one of the vomiting episodes. No chest pain. No trouble breathing. No sick contacts.   Past Medical History  Diagnosis Date  . CHF (congestive heart failure)   . Hypertension    History reviewed. No pertinent past surgical history. Family History  Problem Relation Age of Onset  . Cancer Mother   . Cancer Maternal Grandmother    Social History  Substance Use Topics  . Smoking status: Never Smoker   . Smokeless tobacco: None  . Alcohol Use: No    Review of Systems  Constitutional: Positive for appetite change. Negative for fever and activity change.  Eyes: Negative for pain.  Respiratory: Negative for chest tightness and shortness of breath.   Cardiovascular: Negative for chest pain and leg swelling.  Gastrointestinal: Positive for nausea and vomiting. Negative for abdominal pain and diarrhea.  Genitourinary: Negative for flank pain.  Musculoskeletal: Negative for back pain and neck stiffness.  Skin: Negative for rash.  Neurological: Negative for weakness, numbness and headaches.  Psychiatric/Behavioral: Negative for behavioral problems.      Allergies  Review of patient's allergies indicates no known allergies.  Home Medications   Prior to Admission medications    Medication Sig Start Date End Date Taking? Authorizing Provider  amLODipine (NORVASC) 10 MG tablet Take 10 mg by mouth daily. 12/29/14  Yes Historical Provider, MD  aspirin 325 MG tablet Take 325 mg by mouth daily.   Yes Historical Provider, MD  ATROVENT HFA 17 MCG/ACT inhaler Inhale 1 puff into the lungs daily as needed for wheezing.  12/29/14  Yes Historical Provider, MD  BREO ELLIPTA 100-25 MCG/INH AEPB Inhale 1 puff into the lungs daily. 12/28/14  Yes Historical Provider, MD  carvedilol (COREG) 25 MG tablet Take 25 mg by mouth 2 (two) times daily with a meal.   Yes Historical Provider, MD  cloNIDine (CATAPRES) 0.2 MG tablet Take 0.2 mg by mouth 2 (two) times daily. 12/29/14  Yes Historical Provider, MD  furosemide (LASIX) 80 MG tablet Take 40 mg by mouth daily.    Yes Historical Provider, MD  lisinopril (PRINIVIL,ZESTRIL) 20 MG tablet Take 20 mg by mouth 2 (two) times daily.    Yes Historical Provider, MD  potassium chloride SA (K-DUR,KLOR-CON) 20 MEQ tablet Take 10-20 mEq by mouth daily. Take 10 meq every day except WED & SUN takes 20 meq   Yes Historical Provider, MD  albuterol (PROVENTIL HFA;VENTOLIN HFA) 108 (90 BASE) MCG/ACT inhaler Inhale 2 puffs into the lungs every 4 (four) hours as needed for wheezing (With spacer.). Patient not taking: Reported on 01/27/2015 05/13/12   Consuello Masse, MD  benzonatate (TESSALON) 100 MG capsule Take 1 capsule (100 mg total) by mouth 3 (three) times daily as needed for cough. Patient not taking: Reported on 01/27/2015  05/13/12   Consuello Masse, MD  chlorthalidone (HYGROTON) 25 MG tablet Take 0.5 tablets (12.5 mg total) by mouth daily. Patient not taking: Reported on 01/27/2015 01/06/13   Susa Griffins, MD  ondansetron (ZOFRAN-ODT) 4 MG disintegrating tablet Take 1 tablet (4 mg total) by mouth every 8 (eight) hours as needed for nausea or vomiting. 01/28/15   Benjiman Core, MD   BP 162/74 mmHg  Pulse 81  Temp(Src) 98.4 F (36.9 C) (Oral)  Resp 19  SpO2  94% Physical Exam  Constitutional: He is oriented to person, place, and time. He appears well-developed and well-nourished.  HENT:  Head: Normocephalic.  Eyes: Pupils are equal, round, and reactive to light.  Cardiovascular: Normal rate, regular rhythm and normal heart sounds.   No murmur heard. Pulmonary/Chest: Effort normal and breath sounds normal.  Abdominal: Soft. He exhibits no distension. There is no tenderness. There is no rebound.  Musculoskeletal: Normal range of motion. He exhibits no edema.  Neurological: He is alert and oriented to person, place, and time. No cranial nerve deficit.  Skin: Skin is warm and dry.  Nursing note and vitals reviewed.   ED Course  Procedures (including critical care time) Labs Review Labs Reviewed  CBC - Abnormal; Notable for the following:    WBC 11.6 (*)    RBC 5.94 (*)    Hemoglobin 17.3 (*)    All other components within normal limits  URINALYSIS, ROUTINE W REFLEX MICROSCOPIC (NOT AT [facility]) - Abnormal; Notable for the following:    APPearance CLOUDY (*)    Glucose, UA 100 (*)    Hgb urine dipstick TRACE (*)    Ketones, ur 40 (*)    Protein, ur >300 (*)    All other components within normal limits  COMPREHENSIVE METABOLIC PANEL - Abnormal; Notable for the following:    Glucose, Bld 157 (*)    ALT 12 (*)    All other components within normal limits  LIPASE, BLOOD  URINE MICROSCOPIC-ADD ON    Imaging Review No results found. I have personally reviewed and evaluated these images and lab results as part of my medical decision-making.   EKG Interpretation None      MDM   Final diagnoses:  Non-intractable vomiting with nausea, vomiting of unspecified type    Patient with nausea vomiting. No URI or chest pain symptoms. Feels better after treatment. Give oral trial and likely discharge home. Lab work overall reassuring.    Benjiman Core, MD 01/28/15 [phone]

## 2015-01-28 MED ORDER — ONDANSETRON 4 MG PO TBDP: 4.0000 mg | ORAL_TABLET | Freq: Three times a day (TID) | ORAL | Status: AC | PRN

## 2015-01-28 MED ORDER — ONDANSETRON HCL 4 MG/2ML IJ SOLN
4.0000 mg | Freq: Once | INTRAMUSCULAR | Status: AC
  Administered 2015-01-28: 4 mg via INTRAVENOUS
  Filled 2015-01-28: qty 2

## 2015-01-28 NOTE — Discharge Instructions (Signed)

## 2015-01-28 NOTE — ED Notes (Signed)
Pt. Unable to tolerated PO fluid intake, c/o N/V, to notify MD.

## 2015-01-28 NOTE — ED Notes (Signed)
Bed: ZO10 Expected date:  Expected time:  Means of arrival:  Comments: EMS 74M n/v

## 2015-02-06 ENCOUNTER — Telehealth (HOSPITAL_COMMUNITY): Payer: Self-pay

## 2015-02-06 NOTE — Telephone Encounter (Signed)
Called patient to discuss Pulmonary Rehab referral from Dr. Danelle Earthly in [place]. Patient states that he could not attend the program due to his busy schedule.  Patient states that he is still working. I encouraged the patient to contact us in the future if his work schedule changes.
